# Patient Record
Sex: Male | Born: 1980
Health system: Southern US, Community
[De-identification: ages and names within clinical notes are randomized; demographics above are authoritative.]

## PROBLEM LIST (undated history)

## (undated) DIAGNOSIS — I10 Essential (primary) hypertension: Secondary | ICD-10-CM

## (undated) DIAGNOSIS — E669 Obesity, unspecified: Secondary | ICD-10-CM

## (undated) DIAGNOSIS — N390 Urinary tract infection, site not specified: Secondary | ICD-10-CM

## (undated) HISTORY — PX: FOOT SURGERY: SHX648

---

## 2008-10-21 ENCOUNTER — Encounter: Admission: RE | Admit: 2008-10-21 | Discharge: 2008-10-21 | Payer: Self-pay | Admitting: Occupational Medicine

## 2011-12-14 ENCOUNTER — Encounter (HOSPITAL_COMMUNITY): Payer: Self-pay | Admitting: Emergency Medicine

## 2011-12-14 ENCOUNTER — Inpatient Hospital Stay (HOSPITAL_COMMUNITY)
Admission: EM | Admit: 2011-12-14 | Discharge: 2011-12-16 | DRG: 584 | Disposition: A | Discharge status: Home / Self Care | Payer: BC Managed Care – PPO | Attending: Family Medicine | Admitting: Family Medicine

## 2011-12-14 DIAGNOSIS — R7402 Elevation of levels of lactic acid dehydrogenase (LDH): Secondary | ICD-10-CM | POA: Diagnosis present

## 2011-12-14 DIAGNOSIS — N390 Urinary tract infection, site not specified: Secondary | ICD-10-CM

## 2011-12-14 DIAGNOSIS — Z87891 Personal history of nicotine dependence: Secondary | ICD-10-CM

## 2011-12-14 DIAGNOSIS — I959 Hypotension, unspecified: Secondary | ICD-10-CM | POA: Diagnosis present

## 2011-12-14 DIAGNOSIS — R112 Nausea with vomiting, unspecified: Secondary | ICD-10-CM

## 2011-12-14 DIAGNOSIS — R000 Tachycardia, unspecified: Secondary | ICD-10-CM | POA: Diagnosis present

## 2011-12-14 DIAGNOSIS — D696 Thrombocytopenia, unspecified: Secondary | ICD-10-CM

## 2011-12-14 DIAGNOSIS — E860 Dehydration: Secondary | ICD-10-CM

## 2011-12-14 DIAGNOSIS — D72819 Decreased white blood cell count, unspecified: Secondary | ICD-10-CM | POA: Diagnosis present

## 2011-12-14 DIAGNOSIS — I498 Other specified cardiac arrhythmias: Secondary | ICD-10-CM | POA: Diagnosis present

## 2011-12-14 DIAGNOSIS — N179 Acute kidney failure, unspecified: Secondary | ICD-10-CM | POA: Diagnosis present

## 2011-12-14 DIAGNOSIS — R7401 Elevation of levels of liver transaminase levels: Secondary | ICD-10-CM | POA: Diagnosis present

## 2011-12-14 DIAGNOSIS — R251 Tremor, unspecified: Secondary | ICD-10-CM | POA: Diagnosis present

## 2011-12-14 DIAGNOSIS — A419 Sepsis, unspecified organism: Secondary | ICD-10-CM | POA: Diagnosis present

## 2011-12-14 DIAGNOSIS — R509 Fever, unspecified: Secondary | ICD-10-CM

## 2011-12-14 DIAGNOSIS — A4151 Sepsis due to Escherichia coli [E. coli]: Principal | ICD-10-CM | POA: Diagnosis present

## 2011-12-14 LAB — CBC WITH DIFFERENTIAL/PLATELET
Eosinophils Absolute: 0 10*3/uL (ref 0.0–0.7)
Eosinophils Relative: 0 % (ref 0–5)
Hemoglobin: 14.3 g/dL (ref 13.0–17.0)
Lymphs Abs: 0.9 10*3/uL (ref 0.7–4.0)
MCH: 22.8 pg — ABNORMAL LOW (ref 26.0–34.0)
MCV: 66 fL — ABNORMAL LOW (ref 78.0–100.0)
Monocytes Relative: 6 % (ref 3–12)
Neutrophils Relative %: 61 % (ref 43–77)
RBC: 6.27 MIL/uL — ABNORMAL HIGH (ref 4.22–5.81)

## 2011-12-14 LAB — URINALYSIS, ROUTINE W REFLEX MICROSCOPIC
Glucose, UA: NEGATIVE mg/dL
Ketones, ur: NEGATIVE mg/dL
Specific Gravity, Urine: 1.019 (ref 1.005–1.030)
pH: 5.5 (ref 5.0–8.0)

## 2011-12-14 LAB — RAPID URINE DRUG SCREEN, HOSP PERFORMED
Amphetamines: NOT DETECTED
Benzodiazepines: NOT DETECTED
Cocaine: NOT DETECTED
Opiates: NOT DETECTED

## 2011-12-14 LAB — HEPATIC FUNCTION PANEL
Bilirubin, Direct: 0.7 mg/dL — ABNORMAL HIGH (ref 0.0–0.3)
Indirect Bilirubin: 0.5 mg/dL (ref 0.3–0.9)
Total Bilirubin: 1.2 mg/dL (ref 0.3–1.2)

## 2011-12-14 LAB — URINE MICROSCOPIC-ADD ON

## 2011-12-14 LAB — ETHANOL: Alcohol, Ethyl (B): <11 mg/dL (ref 0–11)

## 2011-12-14 LAB — BASIC METABOLIC PANEL
CO2: 16 mEq/L — ABNORMAL LOW (ref 19–32)
Glucose, Bld: 177 mg/dL — ABNORMAL HIGH (ref 70–99)
Potassium: 3.7 mEq/L (ref 3.5–5.1)
Sodium: 137 mEq/L (ref 135–145)

## 2011-12-14 MED ORDER — METOPROLOL TARTRATE 1 MG/ML IV SOLN
5.0000 mg | Freq: Once | INTRAVENOUS | Status: AC
  Administered 2011-12-14: 5 mg via INTRAVENOUS

## 2011-12-14 MED ORDER — LORAZEPAM 2 MG/ML IJ SOLN
1.0000 mg | Freq: Once | INTRAMUSCULAR | Status: AC
  Administered 2011-12-14: 2 mg via INTRAVENOUS

## 2011-12-14 MED ORDER — ADENOSINE 6 MG/2ML IV SOLN
6.0000 mg | Freq: Once | INTRAVENOUS | Status: AC
Start: 2011-12-14 — End: 2011-12-14
  Administered 2011-12-14: 6 mg via INTRAVENOUS

## 2011-12-14 MED ORDER — SODIUM CHLORIDE 0.9 % IV SOLN
INTRAVENOUS | Status: DC
  Administered 2011-12-14 (×2): via INTRAVENOUS

## 2011-12-14 MED ORDER — METOPROLOL TARTRATE 1 MG/ML IV SOLN
INTRAVENOUS | Status: AC
  Administered 2011-12-14: 5 mg via INTRAVENOUS
  Filled 2011-12-14: qty 5

## 2011-12-14 MED ORDER — ADENOSINE 6 MG/2ML IV SOLN
12.0000 mg | Freq: Once | INTRAVENOUS | Status: AC
  Administered 2011-12-14: 12 mg via INTRAVENOUS

## 2011-12-14 MED ORDER — SODIUM CHLORIDE 0.9 % IV BOLUS (SEPSIS)
1000.0000 mL | Freq: Once | INTRAVENOUS | Status: AC
  Administered 2011-12-14: 1000 mL via INTRAVENOUS

## 2011-12-14 MED ORDER — LORAZEPAM 2 MG/ML IJ SOLN
1.0000 mg | Freq: Once | INTRAMUSCULAR | Status: AC
  Administered 2011-12-14: 1 mg via INTRAVENOUS
  Filled 2011-12-14: qty 1

## 2011-12-14 MED ORDER — DEXTROSE 5 % IV SOLN
1.0000 g | Freq: Once | INTRAVENOUS | Status: AC
  Administered 2011-12-14: 1 g via INTRAVENOUS
  Filled 2011-12-14: qty 10

## 2011-12-14 MED ORDER — ADENOSINE 6 MG/2ML IV SOLN
6.0000 mg | Freq: Once | INTRAVENOUS | Status: AC
  Administered 2011-12-14: 6 mg via INTRAVENOUS
  Filled 2011-12-14 (×2): qty 6

## 2011-12-14 MED ORDER — LORAZEPAM 2 MG/ML IJ SOLN
INTRAMUSCULAR | Status: AC
  Administered 2011-12-14: 2 mg via INTRAVENOUS
  Filled 2011-12-14: qty 1

## 2011-12-14 MED ORDER — ACETAMINOPHEN 325 MG PO TABS
975.0000 mg | ORAL_TABLET | Freq: Once | ORAL | Status: AC
  Administered 2011-12-14: 975 mg via ORAL
  Filled 2011-12-14: qty 3

## 2011-12-14 NOTE — ED Notes (Signed)
Patient currently sitting up in bed; no respiratory or acute distress noted.  Patient updated on plan of care; informed patient that we are currently waiting on urine results to come back; patient has no other questions or concerns at this time.  Patient has stopped shaking; patient seems relaxed.  Heart rate 110's-120's.  Family present at bedside.  Will continue to monitor.

## 2011-12-14 NOTE — ED Provider Notes (Addendum)
History     CSN: [id number]  Arrival date & time 12/14/11  8295   First MD Initiated Contact with Patient 12/14/11 1939      Chief Complaint  Patient presents with  . Tachycardia    (Consider location/radiation/quality/duration/timing/severity/associated sxs/prior treatment) HPI Comments: Richard Villa is a 31 y.o. Male patient presents for "shaking." He arrived by private vehicle. At triage, his pulse was 119. He was immediately brought to the back. Interventional care was initiated.   Level V Caveat- severe illness  The history is provided by the patient.    No past medical history on file.  No past surgical history on file.  No family history on file.  History  Substance Use Topics  . Smoking status: Not on file  . Smokeless tobacco: Not on file  . Alcohol Use: Not on file      Review of Systems  Unable to perform ROS   Allergies  Toradol  Home Medications   Current Outpatient Rx  Name Route Sig Dispense Refill  . MELATONIN PO Oral Take 1 tablet by mouth at bedtime as needed. For sleep    . PROMETHAZINE HCL 25 MG PO TABS Oral Take 25 mg by mouth every 6 (six) hours as needed. For nausea/vomiting    . PSEUDOEPH-DOXYLAMINE-DM-APAP 60-7.10-02-998 MG/30ML PO LIQD Oral Take 30 mLs by mouth at bedtime as needed. For pain/fever.    Marland Kitchen PSEUDOEPHEDRINE-APAP-DM 62-130-86 MG/30ML PO LIQD Oral Take 30 mLs by mouth 3 (three) times daily as needed. For fever/pain    . SULFAMETHOXAZOLE-TMP DS 800-160 MG PO TABS Oral Take 1 tablet by mouth 2 (two) times daily. X 10 days. Started on 12/12/11      BP 100/58  Pulse 120  Temp 98.9 F (37.2 C) (Oral)  Resp 20  SpO2 97%  Physical Exam  Nursing note and vitals reviewed. Constitutional: He is oriented to person, place, and time. He appears well-developed and well-nourished. He appears distressed (moderate).  HENT:  Head: Normocephalic and atraumatic.  Right Ear: External ear normal.  Left Ear: External ear normal.    Eyes: Conjunctivae and EOM are normal. Pupils are equal, round, and reactive to light.  Neck: Normal range of motion and phonation normal. Neck supple.  Cardiovascular: Normal rate, regular rhythm, normal heart sounds and intact distal pulses.   Pulmonary/Chest: Effort normal and breath sounds normal. He exhibits no bony tenderness.  Abdominal: Soft. Normal appearance. There is no tenderness.  Musculoskeletal: Normal range of motion.  Neurological: He is alert and oriented to person, place, and time. He has normal strength. No cranial nerve deficit or sensory deficit. He exhibits normal muscle tone. Coordination normal.  Skin: Skin is warm, dry and intact.  Psychiatric: He has a normal mood and affect. His behavior is normal. Judgment and thought content normal.    ED Course  Procedures (including critical care time)  Initial Emergent care: He was prepared for adenosine cardioversion. He was given 2 rounds of 6 mg of adenosine, followed by 12 mg IV, without conversion. The bolus, was done, and his blood sugar improved to greater than 120 systolic. He was then given IV pressor 5 mg. The heart rate, then gradually improved to about 130. He continued to be tachycardic with normal low, blood pressure. Temperature was elevated at 102.9, he was treated with Tylenol. He also received Ativan, for continued shakiness.   Patient's fianc arrived to get additional history. Patient was also able to contribute better at around 2200 hrs. Marland Kitchen He  relates having a urinary tract infection, treated, 2 days ago, with Septra and Toradol orally. He drinks socially, but not heavily by report, the patient and fiancee. 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   Additional treatment, Rocephin, IV for UTI.   Date: 12/14/2011-19:35  Rate: 171  Rhythm: supraventricular tachycardia (SVT)  QRS Axis: normal  Intervals: normal  ST/T Wave abnormalities: nonspecific ST changes  Conduction Disutrbances:none  Narrative  Interpretation:   Old EKG Reviewed: none available   Date: 12/14/2011-20:00  Rate: 135  Rhythm: sinus tachycardia  QRS Axis: normal  Intervals: normal  ST/T Wave abnormalities: nonspecific ST changes  Conduction Disutrbances:none  Narrative Interpretation: Artifact  Old EKG Reviewed: changes noted and rate slower     CRITICAL CARE Performed by: Mancel Bale L   Total critical care time: 50 min  Critical care time was exclusive of separately billable procedures and treating other patients.  Critical care was necessary to treat or prevent imminent or life-threatening deterioration.  Critical care was time spent personally by me on the following activities: development of treatment plan with patient and/or surrogate as well as nursing, discussions with consultants, evaluation of patient's response to treatment, examination of patient, obtaining history from patient or surrogate, ordering and performing treatments and interventions, ordering and review of laboratory studies, ordering and review of radiographic studies, pulse oximetry and re-evaluation of patient's condition.   Labs Reviewed  BASIC METABOLIC PANEL - Abnormal; Notable for the following:    CO2 16 (*)     Glucose, Bld 177 (*)     Creatinine, Ser 1.76 (*)     GFR calc non Af Amer 50 (*)     GFR calc Af Amer 57 (*)     All other components within normal limits  CBC WITH DIFFERENTIAL - Abnormal; Notable for the following:    WBC 2.7 (*)     RBC 6.27 (*)     MCV 66.0 (*)     MCH 22.8 (*)     Platelets 111 (*)  PLATELET COUNT CONFIRMED BY SMEAR   All other components within normal limits  URINALYSIS, ROUTINE W REFLEX MICROSCOPIC - Abnormal; Notable for the following:    Color, Urine AMBER (*)  BIOCHEMICALS MAY BE AFFECTED BY COLOR   APPearance CLOUDY (*)     Hgb urine dipstick LARGE (*)     Bilirubin Urine SMALL (*)     Protein, ur 100 (*)     Urobilinogen, UA 2.0 (*)     Leukocytes, UA SMALL (*)     All  other components within normal limits  AMMONIA - Abnormal; Notable for the following:    Ammonia 74 (*)     All other components within normal limits  PROTIME-INR - Abnormal; Notable for the following:    Prothrombin Time 15.3 (*)     All other components within normal limits  URINE MICROSCOPIC-ADD ON - Abnormal; Notable for the following:    Squamous Epithelial / LPF FEW (*)     Bacteria, UA MANY (*)     Casts GRANULAR CAST (*)     All other components within normal limits  HEPATIC FUNCTION PANEL - Abnormal; Notable for the following:    AST 88 (*)     ALT 122 (*)     Alkaline Phosphatase 149 (*)     Bilirubin, Direct 0.7 (*)     All other components within normal limits  TROPONIN I  URINE RAPID DRUG SCREEN (HOSP PERFORMED)  ETHANOL  No results found.   1. UTI (lower urinary tract infection)   2. Dehydration   3. Leukopenia   4. Thrombocytopenia   5. Tachycardia       MDM  Febrile illness with extreme sinus tachycardia.  Doubt SVT .Apparent urinary tract infection.  Possibly incidental, ammonia elevation, however, component of hepatic encephalopathy is suspected. This would tend to indicate that the patient is a heavy alcohol user. Low white cells and platelets with normal Hb, is worrisome for possible pancytopenia, with dehydration. Pt needs admission.       Flint Melter, MD 12/14/11 2316  Flint Melter, MD 12/15/11 0000

## 2011-12-14 NOTE — ED Notes (Addendum)
Patient came in from triage with SVT rhythm (heart rate from 170-180s); EDP (Dr. Effie Shy) notified upon patient's arrival.  Patient changed into gown and connected to continuous cardiac, pulse ox, and blood pressure monitor.  Patient very diaphoretic and shaking.  Patient first given 6 mg Adenosine IVP per verbal order by Dr. Effie Shy.  Heart rate decreased to 150s, then back up to 170s.  Another 6 mg Adenosine pushed, per verbal order by Dr. Effie Shy.  Heart rate decreased to 160s again.  Another 12 mg Adenosine pushed; heart reate decreased to 140s-150s.  5 mg Lopressor given, per verbal order by Dr. Effie Shy.  Heart rate decreased to 120s-130s; sinus tach rhythm.  1 mg Ativan given IVP per verbal order by Dr. Effie Shy due to patient's shakiness.  Patient now 130-140s heart rate; patient denies pain at this time.  Still shaking.  Patient states that he started "feeling weird" while sitting on the couch at home; called neighbor.  Neighbor drove patient to hospital. Patient denies doing any drugs or drinking alcohol tonight.  Denies any past medical history.  Patient's pupils equal, but dilated.  Patient alert and oriented x4; will continue to monitor.  Crash cart placed outside patient's room.

## 2011-12-14 NOTE — ED Notes (Signed)
Dr. Effie Shy notified about fever; verbal order verified by read back to give patient 975 mg Tylenol PO.

## 2011-12-14 NOTE — ED Notes (Signed)
Admitting MD at bedside.

## 2011-12-14 NOTE — ED Notes (Signed)
Pt was just standing and felt numbness in hands; pouring sweat; brought in by neighbor; HR 180's and very diaphoretic. Pt is pale and shaking. Pt reports he has been sick for 3 days.

## 2011-12-14 NOTE — H&P (Signed)
Richard Villa is an 31 y.o. male.   Chief Complaint: Tachycardia, shakiness HPI: 31 yo otherwise healthy M p/w "shaking."  Upon arrival to the ED, he was found to be in SVT (HR 180s), diaphoretic, shaking.  He was given adenosine x3 (6mg , 6mg , 12mg ) with minimal response in his heart rate.  He was also given metoprolol 5mg  IV with minimal response in HR (briefly decreased to 150's per EDP report).   His HR began to resolve with IVF, and he is now s/p 1L NS bolus with IVF going at 125cc/hr.  While in the ED, pt became febrile and was given Tylenol x1 for this.  Patient is here with fiance, who gave most of the history due to pt being very tired (s/p ativan 1mg  x2).  Fiance reports that pt was in his usual state of health until Wednesday (3 days ago) when he had a short period of time with significant low back and pelvic pain as well as dysuria and a small amount of hematuria.  They thought that he passed a kidney stone.  He started having fevers Wed evening (up to 102).  They were seen at [facility] on Thursday and he was diagnosed with UTI and started on Septra.  He was also given Rx's for ketorolac and phenergan, but he only took a few doses of each because he started feeling better from a pain and nausea stand point.  He continued to have fevers off and on, which they treated with tylenol, Dayquil, and Nyquill.  Still having low back pain and feels general malaise/weakness all over.  Did not have any chest pain or palpatiations with this episode.  Did not have any shortness of breath.  Did have some confusion and generalized weakness.    Of note, pt does drink EtOH.  Per fiance it averages 4 days/week and will be 1-2 drinks at a time (wine, liquor or beer).   She reports that her friends say this is significantly less than he used to drink 5 years ago.  He father is an alcoholic and has required a valve replacement (x2, same valve).  They report his last drink as last week.  He does have a h/o tobacco use, but has  not smoked cigarettes in >5 years.  He continues to smoke ~ 1 cigar/week.  He has a remote h/o THC and cocaine use (both >10 years ago).  Fiance does think that pt has looked "a little yellow" for the past few months.  No new medications other than Septra, no new supplements or OTC medications.  Also of note, patient is a [employer] fund[employer] and works in a lab with chemicals.  He has not been to work since he became sick on Wednesday and there have been no recent spills/contaminations that either he or the fiance know of.    No past medical history on file.  No past surgical history on file.  No family history on file. Social History:  does not have a smoking history on file. He does not have any smokeless tobacco history on file. His alcohol and drug histories not on file.  Allergies:  Allergies  Allergen Reactions  . Toradol (Ketorolac Tromethamine) Other (See Comments)    headache     (Not in a hospital admission)  Results for orders placed during the hospital encounter of 12/14/11 (from the past 48 hour(s))  BASIC METABOLIC PANEL     Status: Abnormal   Collection Time   12/14/11  7:47 PM  Component Value Range Comment   Sodium 137  135 - 145 mEq/L    Potassium 3.7  3.5 - 5.1 mEq/L    Chloride 101  96 - 112 mEq/L    CO2 16 (*) 19 - 32 mEq/L    Glucose, Bld 177 (*) 70 - 99 mg/dL    BUN 16  6 - 23 mg/dL    Creatinine, Ser 1.61 (*) 0.50 - 1.35 mg/dL    Calcium 9.6  8.4 - 09.6 mg/dL    GFR calc non Af Amer 50 (*) >90 mL/min    GFR calc Af Amer 57 (*) >90 mL/min   CBC WITH DIFFERENTIAL     Status: Abnormal   Collection Time   12/14/11  7:47 PM      Component Value Range Comment   WBC 2.7 (*) 4.0 - 10.5 K/uL    RBC 6.27 (*) 4.22 - 5.81 MIL/uL    Hemoglobin 14.3  13.0 - 17.0 g/dL    HCT 04.5  40.9 - 81.1 %    MCV 66.0 (*) 78.0 - 100.0 fL    MCH 22.8 (*) 26.0 - 34.0 pg    MCHC 34.5  30.0 - 36.0 g/dL    RDW 91.4  78.2 - 95.6 %    Platelets 111 (*) 150 - 400 K/uL PLATELET COUNT  CONFIRMED BY SMEAR   Neutrophils Relative 61  43 - 77 %    Neutro Abs 1.7  1.7 - 7.7 K/uL    Lymphocytes Relative 32  12 - 46 %    Lymphs Abs 0.9  0.7 - 4.0 K/uL    Monocytes Relative 6  3 - 12 %    Monocytes Absolute 0.2  0.1 - 1.0 K/uL    Eosinophils Relative 0  0 - 5 %    Eosinophils Absolute 0.0  0.0 - 0.7 K/uL    Basophils Relative 1  0 - 1 %    Basophils Absolute 0.0  0.0 - 0.1 K/uL   TROPONIN I     Status: Normal   Collection Time   12/14/11  7:47 PM      Component Value Range Comment   Troponin I <0.30  <0.30 ng/mL   ETHANOL     Status: Normal   Collection Time   12/14/11  7:47 PM      Component Value Range Comment   Alcohol, Ethyl (B) <11  0 - 11 mg/dL   AMMONIA     Status: Abnormal   Collection Time   12/14/11  9:06 PM      Component Value Range Comment   Ammonia 74 (*) 11 - 60 umol/L   PROTIME-INR     Status: Abnormal   Collection Time   12/14/11  9:06 PM      Component Value Range Comment   Prothrombin Time 15.3 (*) 11.6 - 15.2 seconds    INR 1.18  0.00 - 1.49   URINALYSIS, ROUTINE W REFLEX MICROSCOPIC     Status: Abnormal   Collection Time   12/14/11  9:41 PM      Component Value Range Comment   Color, Urine AMBER (*) YELLOW BIOCHEMICALS MAY BE AFFECTED BY COLOR   APPearance CLOUDY (*) CLEAR    Specific Gravity, Urine 1.019  1.005 - 1.030    pH 5.5  5.0 - 8.0    Glucose, UA NEGATIVE  NEGATIVE mg/dL    Hgb urine dipstick LARGE (*) NEGATIVE    Bilirubin Urine SMALL (*) NEGATIVE  Ketones, ur NEGATIVE  NEGATIVE mg/dL    Protein, ur 161 (*) NEGATIVE mg/dL    Urobilinogen, UA 2.0 (*) 0.0 - 1.0 mg/dL    Nitrite NEGATIVE  NEGATIVE    Leukocytes, UA SMALL (*) NEGATIVE   URINE RAPID DRUG SCREEN (HOSP PERFORMED)     Status: Normal   Collection Time   12/14/11  9:41 PM      Component Value Range Comment   Opiates NONE DETECTED  NONE DETECTED    Cocaine NONE DETECTED  NONE DETECTED    Benzodiazepines NONE DETECTED  NONE DETECTED    Amphetamines NONE DETECTED  NONE  DETECTED    Tetrahydrocannabinol NONE DETECTED  NONE DETECTED    Barbiturates NONE DETECTED  NONE DETECTED   URINE MICROSCOPIC-ADD ON     Status: Abnormal   Collection Time   12/14/11  9:41 PM      Component Value Range Comment   Squamous Epithelial / LPF FEW (*) RARE    WBC, UA 7-10  <3 WBC/hpf    RBC / HPF 7-10  <3 RBC/hpf    Bacteria, UA MANY (*) RARE    Casts GRANULAR CAST (*) NEGATIVE    No results found.  Review of Systems  Constitutional: Positive for fever, chills and malaise/fatigue.  HENT: Negative for congestion and sore throat.   Eyes: Negative for blurred vision and double vision.  Respiratory: Positive for cough. Negative for shortness of breath and wheezing.   Cardiovascular: Negative for chest pain, palpitations and leg swelling.  Gastrointestinal: Negative for heartburn, nausea, vomiting, abdominal pain, diarrhea and constipation.  Genitourinary: Positive for dysuria and hematuria.  Musculoskeletal: Positive for back pain.  Skin: Negative for rash.  Neurological: Positive for dizziness, tingling, weakness and headaches.  Psychiatric/Behavioral: Negative for substance abuse.    Blood pressure 100/58, pulse 120, temperature 98.9 F (37.2 C), temperature source Oral, resp. rate 20, SpO2 97.00%. Physical Exam  Constitutional: He is oriented to person, place, and time. He appears well-developed and well-nourished.  HENT:  Head: Normocephalic and atraumatic.  Right Ear: External ear normal.  Left Ear: External ear normal.  Mouth/Throat: Oropharynx is clear and moist. No oropharyngeal exudate.  Eyes: Conjunctivae and EOM are normal. Pupils are equal, round, and reactive to light. No scleral icterus.  Neck: Normal range of motion. Neck supple.  Cardiovascular: Regular rhythm and intact distal pulses.  Tachycardia present.   No murmur heard. Respiratory: Effort normal and breath sounds normal. No respiratory distress. He has no wheezes.  GI: Soft. Bowel sounds are  normal. He exhibits no distension. There is no tenderness. There is no rebound.  Genitourinary: Testes normal and penis normal. Rectal exam shows no tenderness and anal tone normal. Guaiac negative stool. Prostate is not enlarged and not tender.  Musculoskeletal: Normal range of motion. He exhibits no edema.  Lymphadenopathy:    He has no cervical adenopathy.  Neurological: He is alert and oriented to person, place, and time. No cranial nerve deficit. Coordination normal.  Skin: Skin is warm. No rash noted. He is diaphoretic. No erythema.     Assessment/Plan 31 yo M with no PMHx p/w tachycardia and "shaking"  1. 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 Fever: unclear source, although may be from UTI -WBC low at 2.7 -s/p CTX 1gm in ED, was on septra as outpatient -add gram stain to urine and await culture results for treatment -should try to avoid tylenol due to mildly elevated LFTs but also avoid NSAIDs due to mildly elevated Cr. -will add ampicillin to cover enterococcus while awaiting gram stain; consider continuing CTX if gram stain shows GNRs   3. AKI: Cr 1.76, may be 2/2 dehydration + ketorolac for pain -IVF -recheck Cr in AM -avoid nephrotoxic medications such as NSAIDs for now   4.  H/o EtOH use: fiance reports that use is NOT daily and is significantly less heavy than previously; last drink was ~ 6-7 days ago -CIWA protocol   5. Transaminitis: mildly elevated LFTs -pt was  on Septra, which could cause some mild hepatitis and hemolysis  -pt has also been taking tylenol and tylenol-containing products; will check acetaminophen level to r/o accidental OD   FEN/GI: NPO in case of need for procedure, NS @ 125cc/hr Ppx: SQ heparin Dispo: admit to SDU for close monitoring overnight. D/c pending clinical improvement   Aser Nylund 12/14/2011, 11:05 PM

## 2011-12-14 NOTE — ED Notes (Signed)
Patient starting to bleed around both IV insertion sites; Dr. Effie Shy notified.  Blood cleaned around IV sites; IVs still intact; flushable.  No active bleeding at this time.

## 2011-12-14 NOTE — ED Notes (Signed)
Patient currently resting quietly in bed; no respiratory or acute distress noted.  Patient updated on plan of care; informed patient that we are currently waiting for Rocephin to finish infusing.  Patient has no other questions or concerns at this time; will continue to monitor.

## 2011-12-15 ENCOUNTER — Encounter (HOSPITAL_COMMUNITY): Payer: Self-pay

## 2011-12-15 LAB — MRSA PCR SCREENING: MRSA by PCR: NEGATIVE

## 2011-12-15 LAB — CREATININE, SERUM
Creatinine, Ser: 1.12 mg/dL (ref 0.50–1.35)
GFR calc non Af Amer: 86 mL/min — ABNORMAL LOW (ref >90)

## 2011-12-15 LAB — CARDIAC PANEL(CRET KIN+CKTOT+MB+TROPI)
CK, MB: 9.2 ng/mL (ref 0.3–4.0)
CK, MB: 10.4 ng/mL (ref 0.3–4.0)
Relative Index: 0.7 (ref 0.0–2.5)
Total CK: 1207 U/L — ABNORMAL HIGH (ref 7–232)
Total CK: 1575 U/L — ABNORMAL HIGH (ref 7–232)
Total CK: 1933 U/L — ABNORMAL HIGH (ref 7–232)
Troponin I: 0.41 ng/mL (ref <0.30)
Troponin I: 0.33 ng/mL (ref <0.30)

## 2011-12-15 LAB — COMPREHENSIVE METABOLIC PANEL
Albumin: 2.9 g/dL — ABNORMAL LOW (ref 3.5–5.2)
BUN: 15 mg/dL (ref 6–23)
Creatinine, Ser: 1.04 mg/dL (ref 0.50–1.35)
GFR calc Af Amer: >90 mL/min (ref >90)
Glucose, Bld: 102 mg/dL — ABNORMAL HIGH (ref 70–99)
Total Bilirubin: 0.9 mg/dL (ref 0.3–1.2)
Total Protein: 6.1 g/dL (ref 6.0–8.3)

## 2011-12-15 LAB — OCCULT BLOOD, POC DEVICE: Fecal Occult Bld: POSITIVE

## 2011-12-15 LAB — CBC
HCT: 34.8 % — ABNORMAL LOW (ref 39.0–52.0)
MCHC: 33.7 g/dL (ref 30.0–36.0)
MCV: 66 fL — ABNORMAL LOW (ref 78.0–100.0)
Platelets: 98 10*3/uL — ABNORMAL LOW (ref 150–400)
RBC: 5.27 MIL/uL (ref 4.22–5.81)
RDW: 14.4 % (ref 11.5–15.5)
WBC: 6.3 10*3/uL (ref 4.0–10.5)

## 2011-12-15 LAB — TSH: TSH: 2.193 u[IU]/mL (ref 0.350–4.500)

## 2011-12-15 MED ORDER — ADULT MULTIVITAMIN W/MINERALS CH
1.0000 | ORAL_TABLET | Freq: Every day | ORAL | Status: DC
  Administered 2011-12-15 – 2011-12-16 (×2): 1 via ORAL
  Filled 2011-12-15 (×2): qty 1

## 2011-12-15 MED ORDER — ONDANSETRON HCL 4 MG PO TABS: 4.0000 mg | ORAL_TABLET | Freq: Four times a day (QID) | ORAL | Status: DC | PRN

## 2011-12-15 MED ORDER — THIAMINE HCL 100 MG/ML IJ SOLN
100.0000 mg | Freq: Every day | INTRAMUSCULAR | Status: DC
  Filled 2011-12-15 (×2): qty 1

## 2011-12-15 MED ORDER — TRAMADOL HCL 50 MG PO TABS
50.0000 mg | ORAL_TABLET | Freq: Four times a day (QID) | ORAL | Status: DC | PRN
  Administered 2011-12-15: 50 mg via ORAL
  Filled 2011-12-15 (×2): qty 1

## 2011-12-15 MED ORDER — OXYCODONE HCL 5 MG PO TABS
5.0000 mg | ORAL_TABLET | ORAL | Status: DC | PRN
  Administered 2011-12-15 – 2011-12-16 (×3): 5 mg via ORAL
  Filled 2011-12-15 (×3): qty 1

## 2011-12-15 MED ORDER — LORAZEPAM 2 MG/ML IJ SOLN: 1.0000 mg | Freq: Four times a day (QID) | INTRAMUSCULAR | Status: DC | PRN

## 2011-12-15 MED ORDER — SODIUM CHLORIDE 0.9 % IV SOLN
2.0000 g | Freq: Four times a day (QID) | INTRAVENOUS | Status: DC
  Administered 2011-12-15 – 2011-12-16 (×6): 2 g via INTRAVENOUS
  Filled 2011-12-15 (×8): qty 2000

## 2011-12-15 MED ORDER — LORAZEPAM 1 MG PO TABS: 1.0000 mg | ORAL_TABLET | Freq: Four times a day (QID) | ORAL | Status: DC | PRN

## 2011-12-15 MED ORDER — VITAMIN B-1 100 MG PO TABS
100.0000 mg | ORAL_TABLET | Freq: Every day | ORAL | Status: DC
  Administered 2011-12-15 – 2011-12-16 (×2): 100 mg via ORAL
  Filled 2011-12-15 (×2): qty 1

## 2011-12-15 MED ORDER — FOLIC ACID 1 MG PO TABS
1.0000 mg | ORAL_TABLET | Freq: Every day | ORAL | Status: DC
  Administered 2011-12-15 – 2011-12-16 (×2): 1 mg via ORAL
  Filled 2011-12-15 (×2): qty 1

## 2011-12-15 MED ORDER — HEPARIN SODIUM (PORCINE) 5000 UNIT/ML IJ SOLN
5000.0000 [IU] | Freq: Three times a day (TID) | INTRAMUSCULAR | Status: DC
  Administered 2011-12-15 – 2011-12-16 (×3): 5000 [IU] via SUBCUTANEOUS
  Filled 2011-12-15 (×7): qty 1

## 2011-12-15 MED ORDER — ONDANSETRON HCL 4 MG/2ML IJ SOLN: 4.0000 mg | Freq: Four times a day (QID) | INTRAMUSCULAR | Status: DC | PRN

## 2011-12-15 NOTE — ED Notes (Signed)
Patient being transported upstairs on portable cardiac monitor with RN. 

## 2011-12-15 NOTE — H&P (Signed)
I interviewed and examined this patient and discussed the care plan with Dr. Fara Boros and the [facility] team and agree with assessment and plan as documented in the admission note for today. It doesn't appear that alcohol intake has been significant enough to contribute to the LFT elevation. The acute rise suggests injury, perhaps from hypoperfusion. The "tetanic" symptoms that occurred in the ER may have been from hyperventilation or toxicity from the OTC cold medications that weren't being normally metabolized by the liver. Further liver tests may clarify the sequence of events. He seems to be responding well to his UTI treatment.     Richard Villa A. Sheffield Slider, MD Family Medicine Teaching Service Attending  12/15/2011 12:27 PM

## 2011-12-15 NOTE — Progress Notes (Signed)
12/15/2011 11:36 AM Gertie Gowda  [id number]  Report called to RN on [id number]. VSS. Transferred with belongings to new room 6741. Family at bedside  Celesta Gentile 8/11/201311:36 AM

## 2011-12-15 NOTE — Progress Notes (Signed)
Daily Progress Note Family Medicine Teaching Service Lacresha Fusilier M. Joevon Holliman, MD Service Pager: [phone]  Subjective: Patient states he is feeling better today. No specific complaints other than headache. Denies abd pain, dysuria or chest pain. Girlfriend is at bedside.  Objective: Vital signs in last 24 hours: Temp:  [98.2 F (36.8 C)-102.9 F (39.4 C)] 98.2 F (36.8 C) (08/11 0726) Pulse Rate:  [93-190] 93  (08/11 0300) Resp:  [14-47] 30  (08/11 0300) BP: (96-129)/(54-91) 102/66 mmHg (08/11 0130) SpO2:  [93 %-100 %] 98 % (08/11 0300) FiO2 (%):  [2 %] 2 % (08/10 2243) Weight:  [265 lb 3.4 oz (120.3 kg)] 265 lb 3.4 oz (120.3 kg) (08/11 0130) Weight change:  Last BM Date: 12/15/11  Intake/Output from previous day: 08/10 0701 - 08/11 0700 In: 2050 [I.V.:2000; IV Piggyback:50] Out: 1500 [Urine:1500] Intake/Output this shift: Total I/O In: -  Out: 350 [Urine:350]  Constitutional: He is oriented to person, place, and time. He appears well-developed and well-nourished.  HEENT:  Normocephalic and atraumatic. MMM Neck: Normal range of motion. Neck supple.  Cardiovascular: Regular rate and rhythm and intact distal pulses. No murmur heard.  Respiratory: Effort normal and breath sounds normal. He has no wheezes.  GI: Soft. He exhibits no distension. There is no tenderness. There is no rebound.  Musculoskeletal: Normal range of motion. He exhibits no edema.  Lymphadenopathy: He has no cervical adenopathy.  Neurological: He is alert and oriented to person, place, and time. No cranial nerve deficit. Coordination normal.  Skin: Skin is warm. No rash noted.   Lab Results:  Basename 12/15/11 0505 12/15/11 0201  WBC 6.3 6.8  HGB 11.7* 11.9*  HCT 34.8* 35.3*  PLT 98* 88*   BMET  Basename 12/15/11 0505 12/15/11 0201 12/14/11 1947  NA 139 -- 137  K 3.4* -- 3.7  CL 106 -- 101  CO2 20 -- 16*  GLUCOSE 102* -- 177*  BUN 15 -- 16  CREATININE 1.04 1.12 --  CALCIUM 8.3* -- 9.6    Medications:  I have reviewed the patient's current medications. Scheduled:   . acetaminophen  975 mg Oral Once  . adenosine (ADENOCARD) IV  12 mg Intravenous Once  . adenosine (ADENOCARD) IV  6 mg Intravenous Once  . adenosine (ADENOCARD) IV  6 mg Intravenous Once  . ampicillin (OMNIPEN) IV  2 g Intravenous Q6H  . cefTRIAXone (ROCEPHIN) IVPB 1 gram/50 mL D5W  1 g Intravenous Once  . folic acid  1 mg Oral Daily  . heparin  5,000 Units Subcutaneous Q8H  . LORazepam  1 mg Intravenous Once  . LORazepam  1 mg Intravenous Once  . metoprolol  5 mg Intravenous Once  . multivitamin with minerals  1 tablet Oral Daily  . sodium chloride  1,000 mL Intravenous Once  . thiamine  100 mg Oral Daily   Or  . thiamine  100 mg Intravenous Daily   Continuous:   . sodium chloride 125 mL/hr at 12/14/11 2126   AVW:[id number], LORazepam, ondansetron (ZOFRAN) IV, ondansetron, oxyCODONE  Assessment/Plan: 31 yo M with no PMHx p/w tachycardia  1. Tachycardia: HR currently <100 with HR of 71 on AM EKG, improved from initial 180s-190s; unclear etiology at this time, one concern is EtOH induced cardiomyopathy with acute tachycardia 2/2 illness causing dehydration + EtOH withdrawal, but keeping anticholinergic medications in mind as well along with thyroid dysfunction, serotonin syndrome, NMS, withdrawal from unknown substance or systemic infection but these are much lower on the differential. Overall, patient  improved and asymptomatic at this time. -responded to IVF; minimal response to adenosine and metoprolol in the ED. Continue IVF for now, but will decrease later today after he his taking good PO and has been stable on telemetry. -BPs remain on the low side of normal, especially for a 31 yo M  -CK elevated, CKMB elevated and troponin mildly elevated, relative index within normal limits. Elevation likely secondary to the tachycardia but could also be from substance use. Will continue to cycle to  trend. -continue to monitor on telemetry and repeat EKG in AM  -ECHO pending today  -will check TSH today  2. Fever: unclear source, although may be from UTI -WBC low at admission, but stable now in normal range -s/p CTX 1gm in ED, was on septra as outpatient. Continue ampicillin to cover enterococcus. Will restart CTX tonight if sensitivities have not returned by then. -await culture results for treatment. Will call [facility] where he was seen earlier in the week to see if they have a culture result from previous UA -we will avoid tylenol due to mildly elevated LFTs.   3. AKI: Cr 1.76 at admission, may be 2/2 dehydration + ketorolac for pain. Cr now within normal limits -continue IVF and avoid nephrotoxic medications such as NSAIDs for now   4. H/o EtOH use: fiance reports that use is NOT daily and is significantly less heavy than previously; last drink was ~ 6-7 days ago. Has PMH of alcohol poisoning 9 years ago. -CIWA protocol  5. Transaminitis: mildly elevated LFTs at admission, now with greatly elevated LFTs -pt was on Septra, which could cause some mild hepatitis and hemolysis but not likely to cause such drastic rise in LFT -tylenol level WNL -could be secondary to ingestion, but could also be from under profusion if he had a time of hypotension -will check hepatitis panel -repeat CMet in the AM  6. FOBT positive- noted at admission, although negative on stool card collected by Dr. Fara Boros at time of rectal exam -HgB stable at 11.7, platelets low at 98 -no gross blood noted. Will continue to monitor  7. Headache- headache since start of symptoms a few days ago. -avoiding Tylenol given increased LFT. Will treat with Tramadol  FEN/GI: Regular diet, continue NS @ 125cc/hr  Ppx: SQ heparin- will consider d/c if platelets drop Dispo: Transfer out of SDU. D/c pending further clinical improvement and work up   LOS: 1 day   Sylvestre Rathgeber 12/15/2011, 9:04 AM

## 2011-12-15 NOTE — Plan of Care (Signed)
Family Medicine Teaching Service PGY-2 Note  Spoke with clinical staff at [facility]. Per verbal report, patient had a UA with culture/sensititives on December 12, 2011 which showed >100k E.coli. Only resistance was to ESBL (extended release beta lactam.) Susceptible to all other antibiotics tested. Will have report faxed.  Sydny Schnitzler M. Alayjah Boehringer, M.D. 12/15/2011 12:34 PM

## 2011-12-15 NOTE — Progress Notes (Addendum)
CRITICAL VALUE ALERT  Critical value received:  Troponin 0.41, CK MB 9.2  Date of notification:  12/15/2011  Time of notification:  0256  Critical value read back:yes  Nurse who received alert:  B. Katrinka Blazing   MD notified (1st page):  Family Practice  Time of first page:  0300  MD notified (2nd page):  Time of second page:  Responding MD: Dr. Fara Boros   Time MD responded:  [phone]

## 2011-12-15 NOTE — ED Notes (Signed)
Admitting MD at bedside.

## 2011-12-15 NOTE — Progress Notes (Signed)
I interviewed and examined this patient and discussed the care plan with Dr. Mikel Cella and the [facility] team and agree with assessment and plan as documented in the progress note for today. We discussed using Tramadol for headache since NSAIDS and Acetaminophen might contribute to liver injury.     Michelena Culmer A. Sheffield Slider, MD Family Medicine Teaching Service Attending  12/15/2011 12:25 PM

## 2011-12-16 LAB — COMPREHENSIVE METABOLIC PANEL
ALT: 911 U/L — ABNORMAL HIGH (ref 0–53)
AST: 455 U/L — ABNORMAL HIGH (ref 0–37)
CO2: 24 mEq/L (ref 19–32)
Chloride: 101 mEq/L (ref 96–112)
Creatinine, Ser: 0.82 mg/dL (ref 0.50–1.35)
GFR calc non Af Amer: >90 mL/min (ref >90)
Total Bilirubin: 0.5 mg/dL (ref 0.3–1.2)

## 2011-12-16 LAB — HIV ANTIBODY (ROUTINE TESTING W REFLEX): HIV: NONREACTIVE

## 2011-12-16 LAB — CBC
MCH: 22.3 pg — ABNORMAL LOW (ref 26.0–34.0)
MCHC: 34.4 g/dL (ref 30.0–36.0)
Platelets: 122 10*3/uL — ABNORMAL LOW (ref 150–400)

## 2011-12-16 LAB — HEPATITIS PANEL, ACUTE: Hep B C IgM: NEGATIVE

## 2011-12-16 LAB — URINE CULTURE
Colony Count: NO GROWTH
Culture: NO GROWTH

## 2011-12-16 MED ORDER — CEPHALEXIN 500 MG PO CAPS: 500.0000 mg | ORAL_CAPSULE | Freq: Four times a day (QID) | ORAL | Status: AC

## 2011-12-16 MED ORDER — CEPHALEXIN 500 MG PO CAPS
500.0000 mg | ORAL_CAPSULE | Freq: Four times a day (QID) | ORAL | Status: DC
  Administered 2011-12-16 (×2): 500 mg via ORAL
  Filled 2011-12-16 (×4): qty 1

## 2011-12-16 MED ORDER — DEXTROSE 5 % IV SOLN
1.0000 g | INTRAVENOUS | Status: DC
  Administered 2011-12-16: 1 g via INTRAVENOUS
  Filled 2011-12-16: qty 10

## 2011-12-16 MED ORDER — IBUPROFEN 800 MG PO TABS
800.0000 mg | ORAL_TABLET | Freq: Three times a day (TID) | ORAL | Status: DC | PRN
  Administered 2011-12-16: 800 mg via ORAL
  Filled 2011-12-16 (×2): qty 1

## 2011-12-16 NOTE — Discharge Summary (Signed)
I have reviewed this discharge summary and agree.    

## 2011-12-16 NOTE — Progress Notes (Signed)
Family Medicine Teaching Service Attending Note  I interviewed and examined patient Richard Villa and reviewed their tests and x-rays.  I discussed with Dr. Fara Boros and reviewed their note for today.  I agree with their assessment and plan.     Additionally  This AM resting easily.  Alert oriented with normal conversation and recall  Presentation most consistent with sepsis and dehydration that has responded to fluids and antibiotics.  Finish 14 day course of antibiotics and follow for resolution of transaminitis

## 2011-12-16 NOTE — Progress Notes (Signed)
  Echocardiogram 2D Echocardiogram has been performed.  Richard Villa 12/16/2011, 2:02 PM

## 2011-12-16 NOTE — Progress Notes (Addendum)
Family Medicine Teaching Service Daily Progress Note Service Page: [phone]  Patient Assessment: Pt is a healthy 31yo man who presented 12/14/11 with urosepsis notable for sinus tachycardia to 170s  Subjective: Pt is reporting a headache and mild cough this morning. He endorses hydrating well although he has little appetite. He has been able to get up and move around his room. His fiancee is concerned about his lack of attention and is requesting Lyme titer. He denies any history of rash or known tick bite.  Objective: Temp:  [97.9 F (36.6 C)-99.6 F (37.6 C)] 99.1 F (37.3 C) (08/12 0524) Pulse Rate:  [78-103] 103  (08/12 0524) Resp:  [20-24] 20  (08/12 0524) BP: (112-136)/(63-86) 136/86 mmHg (08/12 0524) SpO2:  [93 %-100 %] 93 % (08/12 0524) Weight:  [257 lb 6.4 oz (116.756 kg)] 257 lb 6.4 oz (116.756 kg) (08/11 2015)  Exam: General: young man lying in bed in NAD Cardiovascular: RRR, no murmurs/rubs/gallops Respiratory: CTAB, no wheezes/rales/rhonci Abdomen: soft, nondistended, nontender, no hepatosplenomegaly Extremities: nonedematous, nontender bilateral LEs   I have reviewed the patient's medications, labs, imaging, and diagnostic testing.  Notable results are summarized below.  CBC BMET   Lab 12/16/11 0700 12/15/11 0505 12/15/11 0201  WBC 6.2 6.3 6.8  HGB 12.1* 11.7* 11.9*  HCT 35.2* 34.8* 35.3*  PLT 122* 98* 88*    Lab 12/16/11 0700 12/15/11 0505 12/15/11 0201 12/14/11 1947  NA 137 139 -- 137  K 3.4* 3.4* -- 3.7  CL 101 106 -- 101  CO2 24 20 -- 16*  BUN 7 15 -- 16  CREATININE 0.82 1.04 1.12 --  GLUCOSE 116* 102* -- 177*  CALCIUM 9.1 8.3* -- 9.6     12/16/11: AST: 455, ALT 911, Tot Bili 0.5 12/15/11: AST: 613, ALT 551, Tot Bili 0.9  Imaging/Diagnostic Tests: Echo: pending  Outside Urine Culture [facility] 8/8): E.Coli >100,000 colonies. Resistant only to ESBLs In house Urine Culture 8/10: No Growth Final  Cardiac Enzymes:  8/11 10:00: CK,MB 10.4, CK  total 1575, Troponin I 0.33 8/11 17:35: CK,MB 11.5, CK total 1933, Troponin I <0.30    . ampicillin (OMNIPEN) IV  2 g Intravenous Q6H  . folic acid  1 mg Oral Daily  . heparin  5,000 Units Subcutaneous Q8H  . multivitamin with minerals  1 tablet Oral Daily  . thiamine  100 mg Oral Daily   Or  . thiamine  100 mg Intravenous Daily  ceftriaxone x 2 doses  Plan: 30 yo M with no PMHx p/w tachycardia and UTI  1. Tachycardia: HR 78-103 last 24 hrs. Unclear etiology although likely 2/2 urosepsis with transient hypotension and end organ impact. Other possibilities include EtOH induced cardiomyopathy given his h/o alcohol abuse, although he reports only 1-2 alcoholic drinks a few days a week currently. Question of other substance use although utox negative. TSH WNL. Overall pt improved and asymptomatic at this time. -CK elevated, CKMB elevated and troponin mildly elevated, relative index within normal limits.  -ECHO pending today   2. Fever 2/2 urosepsis  - OSH culture indicated E.Coli, resistant only to ESBL -s/p CTX 1gm in ED 8/10, was on septra as outpatient. Amp added on admission to cover enterococci. 2nd dose of CTX: 8/12 AM -will dc ampicillin at this time as E. Coli identified and transition to keflex 500mg  QID -we will avoid tylenol due to mildly elevated LFTs.  -consider renal ultrasound or CT scan in outpatient setting to rule out underlying structural etiology for UTI -  HIV test today given his unusual presentation w/ urosepsis after initiation of appropriate antibiotic regimen   3. AKI: Cr 1.76 at admission, may be 2/2 urosepsis. Cr now within normal limits  -resolved  4. H/o EtOH use: fiance reports that use is NOT daily and is significantly less heavy than previously; last drink was ~ 6-7 days ago. Has PMH of alcohol poisoning 9 years ago.  -CIWA protocol: scores not meriting intervention thus far  5. Transaminitis: mildly elevated LFTs at admission, now with greatly elevated  LFTs. Unclear etiology: possible end organ damage due to urosepsis and hypotension. Pt was on Septra, which could cause some mild hepatitis and hemolysis but not likely to cause such drastic rise in LFT. tylenol level WNL  -will check hepatitis panel  -advise close f/up on outpt basis  6. FOBT positive- noted at admission, although negative on stool card collected by Dr. Fara Boros at time of rectal exam  -HgB improved to 12.1  -no gross blood noted. Will continue to monitor   7. Headache- headache since start of symptoms a few days ago.  -avoiding Tylenol given increased LFT. Will treat with Tramadol and Motrin.  FEN/GI: Regular diet, SLIV Ppx: SQ heparin- will consider d/c if platelets drop  Dispo: D/c pending today LOS: 1 day    Hedy Camara, Med Student 12/16/2011, 8:36 AM   UPPER LEVEL ADDENDUM  I have seen and examined Gertie Gowda with Hedy Camara, MS4 and I agree with the above assessment/plan. I have reviewed all available data and have made any necessary changes. Briefly, Mr. Terri Skains looks significantly better than on admission.  The presumed course of events is that pt has a UTI (E coli, pan sensitive according to UC culture from 8/7) to which he developed urosepsis.  He presented with urosepsis, accounting for his tachycardia, leukopenia, fever, hypotension and endo-organ damaged as evidenced by his elevated Cr and LFTs and mildly elevated troponin.   Today, his only complaints are continued headache as well as generalized weakness/fatigue.  He feels like he would like to go home today after his ECHO.  PE: Filed Vitals:   12/16/11 1332  BP: 134/87  Pulse: 89  Temp: 97.9 F (36.6 C)  Resp: 18   Gen: NAD, no longer diaphoretic although skin is warm CV: RRR, no murmus Pulm: clear bilaterally, no wheezes Abd: soft, nontender, no hepatomegaly or RUQ tenderness, no rebound, no ascites   A/P: 31 yo M w/ some PMH of substance abuse presenting with fever, tachycardia to 190s,  diaphoresis, found to have UTI and urosepsis  1. Urosepsis: likely inciting event for all of the patient's presenting symptoms -will give 14 day antibiotic course with Keflex for his pansensitive E coli; will d/c home septra due to liver abnormalities at this time -urine culture he was negative -BP and tachycardia have normalized, pt has been afebrile -consider outpatient imaging due to UTI in otherwise healthy male -will check HIV due to progression of UTI to urosepsis despite adequate outpt abx  2. Tachycardia: resolved with IVF and treatment of UTI  3. Transamininitis: most likely 2/2 end-organ hypoperfusion from hypotension and sepsis  -hepatitis panel pending; hep B and C negative   -LFTs remain significantly elevated -counseled on the importance of avoiding EtOH, tylenol, other hepatotoxic medications   Dispo: d/c home today with f/u at [facility] in 1 week for recheck of CMET  Kye Hedden 12/16/2011, 2:20 PM

## 2011-12-16 NOTE — Discharge Summary (Signed)
Family Medicine Teaching [facility] Discharge Summary  Patient name: Richard Villa Medical record number: [id number] Date of birth: [birth year] Age: 31 y.o. Gender: male Date of Admission: 12/14/2011  Date of Discharge: 12/16/11 Admitting Physician: Tobin Chad, MD  Primary Care Provider: Sheila Oats, MD  Indication for Hospitalization: Urosepsis  Discharge Diagnoses:  1. Urosepsis  2. Sinus Tachycardia 3. Acute Kidney Injury 4. Transaminitis  Consultations: none  Significant Labs and Imaging:  - Outside Urine Culture [facility] 8/8): E.Coli >100,000 colonies.Resistant only to ESBLs  - In house Urine Culture 8/10: No Growth Final  - Admission Labs: WBC 2.7, AST 88, ALT 122, Cr 1.76, UDS negative - Day of Discharge Labs: WBC 6.2, AST 455, ALT 911, Cr 0.82 - Hepatitis Panel: Hep A, B, C negative - HIV: Pending - Echo 12/16/11: Normal, EF 55-60%  Procedures: none  Brief Hospital Course:  Richard Villa was admitted 12/14/11 w/ fever, sinus tachycardia to the 180-190ss, hypotension, and leukopenia to 2.7. He had been diagnosed w/ a UTI 12/12/11 at [facility] 2 days prior and started on bactrim. His presentation was consistent w/ urosepsis and his tachycardia, although minimally responsive to adenosine and metoprolol, responded to IVF and continued treatment of his UTI with ceftriaxone and ampicillin, initially, due to concern for enterococcus. Once culture results from [facility] were obtained which indicated E.Coli, resistant only to ESBLs, his antibiotic regimen was switched to keflex 500mg  PO four times daily. An Echo was obtained to rule out cardiomyopathy given his history of alcohol use and presentation with significant tachycardia, and was unremarkable. His labs also indicated AKI (Cr to 1.76) and transaminitis (AST to 455, ALT to 911) presumably due to hypoperfusion due to sepsis. His Cr trended back to baseline. His LFTs were still elevated at  discharge and need to be followed on an outpatient basis. A hepatitis panel was negative.   Discharge Medications:  Medication List  As of 12/16/2011  4:01 PM   STOP taking these medications         DAYQUIL MULTI-SYMPTOM 60-650-20 MG/30ML Liqd      NYQUIL 60-7.10-02-998 MG/30ML Liqd      sulfamethoxazole-trimethoprim 800-160 MG per tablet         TAKE these medications         cephALEXin 500 MG capsule   Commonly known as: KEFLEX   Take 1 capsule (500 mg total) by mouth 4 (four) times daily.      MELATONIN PO   Take 1 tablet by mouth at bedtime as needed. For sleep      promethazine 25 MG tablet   Commonly known as: PHENERGAN   Take 25 mg by mouth every 6 (six) hours as needed. For nausea/vomiting           Issues for Follow Up: - transaminitis: Presumable etiology is hypoperfusion 2/2 sepsis. Please follow his LFTs on outpatient basis for resolution. Pt was counseled re: abstaining from tylenol and alcohol, pertinent given upcoming bachelors party.  - HIV test: pending at time of discharge - Consider renal ultrasound or CT scan to assess for renal structural abnormalities.  Outstanding Results: HIV test  Discharge Instructions: Please refer to Patient Instructions section of EMR for full details.  Patient was counseled important signs and symptoms that should prompt return to medical care, changes in medications, dietary instructions, activity restrictions, and follow up appointments.    Follow-up Information    Follow up with [facility], MD on 12/27/2011. (at 3:15pm)  Contact information:   [address] [place] [address] [phone]          Discharge Condition: Improved  Erikka Follmer, MD 12/16/2011, 4:01 PM

## 2011-12-17 ENCOUNTER — Telehealth: Payer: Self-pay | Admitting: Family Medicine

## 2011-12-17 NOTE — Telephone Encounter (Signed)
Patient calling because he is concerned that he has a sore throat and wants to make sure there is nothing wrong.  Still with some chills and generalized weakness, but overall feeling much better.  Patient also concerned because he needs to have paper work filled out since he requires short term disability because of the hospitalization.  Pt given clinic fax number [phone]) to have his work fax the papers that need to be signed to [place].  Informed pt I will fill them out tomorrow and fax them back to his employer if fax number is provided.

## 2011-12-17 NOTE — Telephone Encounter (Signed)
Called pt. Pt said, that his d/c summary states 'increase activity slowly'. Please change letter to 'may return to work on Thursday or after..' pt also reports, that he does not feel well at all today and wants to ask Dr.Mcgill some questions about his condition. He has an appt with her on Friday. Letter needs to be faxed to: Attn: Annye Asa at [phone] Fwd. To Dr.McGill to address. Thanks.Arlyss Repress

## 2011-12-17 NOTE — Telephone Encounter (Signed)
Patient is calling with some questions for Dr. Fara Boros.  He also needs a letter for work faxed to his workplace.  He has questions about what needs to be put in the letter as well.

## 2011-12-18 ENCOUNTER — Telehealth: Payer: Self-pay

## 2011-12-18 NOTE — Telephone Encounter (Signed)
Forms dropped off.  Please fax when completed.  Placed in [place] box.

## 2011-12-18 NOTE — Telephone Encounter (Signed)
Completed.  Needs Tax ID #, then can be faxed.  Placed in to-be-faxed with tax ID highlighted for completion.

## 2011-12-27 ENCOUNTER — Ambulatory Visit (INDEPENDENT_AMBULATORY_CARE_PROVIDER_SITE_OTHER): Payer: BC Managed Care – PPO | Admitting: Family Medicine

## 2011-12-27 ENCOUNTER — Encounter: Payer: Self-pay | Admitting: Family Medicine

## 2011-12-27 VITALS — BP 136/79 | HR 65 | Temp 98.6°F | Ht 75.0 in | Wt 254.1 lb

## 2011-12-27 DIAGNOSIS — R7989 Other specified abnormal findings of blood chemistry: Secondary | ICD-10-CM | POA: Insufficient documentation

## 2011-12-27 DIAGNOSIS — A419 Sepsis, unspecified organism: Secondary | ICD-10-CM | POA: Insufficient documentation

## 2011-12-27 DIAGNOSIS — R000 Tachycardia, unspecified: Secondary | ICD-10-CM

## 2011-12-27 DIAGNOSIS — N390 Urinary tract infection, site not specified: Secondary | ICD-10-CM

## 2011-12-27 NOTE — Assessment & Plan Note (Signed)
Check CMET-- if normal, end of work up.  If still elevated but overall improved, f/u for repeat CMET in a few weeks.  If still rising, will likely warrant a referral to hepatologist.  Hepatitis panel and HIV negative.

## 2011-12-27 NOTE — Assessment & Plan Note (Signed)
Normal rate today. No murmur.  Normal ECHO in hospital.

## 2011-12-27 NOTE — Assessment & Plan Note (Signed)
Resolved.  Could consider outpt renal U/S or CT scan to ensure no structural abnormalities, although this it pt's first UTI.

## 2011-12-27 NOTE — Progress Notes (Signed)
S: Pt comes in today for hospital follow up 2/2 urosepsis.  Patient initially presented with significant tachycardia and was found to have elevated Cr and LFTs, likely 2/2 end organ damage from hypoperfusion due to his sepsis.  Prior to d/c, his Cr normalized but his LFTs remained elevated.  Since d/c home, patient has been feeling well.  He completed his 14 day course of Keflex without issues.  No further fevers, still occasionally having sweats.  No dysuria or flank pain.  Does have his bachelor party this weekend and would like to know if he is able to have a beer to 2 on his camping trip.     ROS: Per HPI  History  Smoking status  . Never Smoker   Smokeless tobacco  . Not on file    O:  Filed Vitals:   12/27/11 1525  BP: 136/79  Pulse: 65  Temp: 98.6 F (37 C)    Gen: NAD CV: RRR, no murmur Pulm: CTA bilat, no wheezes or crackles Abd: soft, NT, + BS, no CVA tenderness Ext: Warm, no chronic skin changes, no edema   A/P: 31 y.o. male p/w  HFU for urosepsis -See problem list -f/u PRN

## 2011-12-27 NOTE — Patient Instructions (Signed)
It was good to see you today!  Have fun this weekend, but not too much fun.  We will recheck your liver enzymes today.  I might have the results back tomorrow morning.  If there is not an improvement in your liver, I will get in touch with you then.  Otherwise, I will call Sunday or Monday and send a letter.  Come back if you need anything!

## 2011-12-28 ENCOUNTER — Encounter: Payer: Self-pay | Admitting: Family Medicine

## 2011-12-28 LAB — COMPREHENSIVE METABOLIC PANEL
ALT: 70 U/L — ABNORMAL HIGH (ref 0–53)
AST: 26 U/L (ref 0–37)
Creat: 1.11 mg/dL (ref 0.50–1.35)
Total Bilirubin: 0.5 mg/dL (ref 0.3–1.2)

## 2011-12-30 ENCOUNTER — Telehealth: Payer: Self-pay | Admitting: Family Medicine

## 2011-12-30 DIAGNOSIS — R7989 Other specified abnormal findings of blood chemistry: Secondary | ICD-10-CM

## 2011-12-30 NOTE — Telephone Encounter (Signed)
Left message for patient to return call. Please have him schedule a lab visit to repeat blood work between 01/20/12 and 01/24/12.Mckynzi Cammon, Rodena Medin

## 2011-12-30 NOTE — Telephone Encounter (Signed)
Please call pt and let him know that his liver function is almost back to normal.  We will repeat the test in 3-4 weeks.  He can make a lab only appointment.  I have placed the future order already. Thanks!

## 2011-12-30 NOTE — Telephone Encounter (Signed)
Patient returned call and lab appointment was scheduled for 01/24/12.Richard Villa, Richard Villa

## 2012-01-02 ENCOUNTER — Ambulatory Visit: Payer: BC Managed Care – PPO | Admitting: Family Medicine

## 2012-01-24 ENCOUNTER — Other Ambulatory Visit: Payer: BC Managed Care – PPO

## 2012-01-24 DIAGNOSIS — R7989 Other specified abnormal findings of blood chemistry: Secondary | ICD-10-CM

## 2012-01-24 NOTE — Progress Notes (Signed)
CMP DONE TODAY [place] 

## 2012-01-25 LAB — COMPREHENSIVE METABOLIC PANEL
CO2: 28 mEq/L (ref 19–32)
Creat: 0.87 mg/dL (ref 0.50–1.35)
Glucose, Bld: 83 mg/dL (ref 70–99)
Total Bilirubin: 0.6 mg/dL (ref 0.3–1.2)
Total Protein: 7.1 g/dL (ref 6.0–8.3)

## 2012-01-26 ENCOUNTER — Encounter: Payer: Self-pay | Admitting: Family Medicine

## 2012-03-12 ENCOUNTER — Other Ambulatory Visit: Payer: Self-pay | Admitting: Chiropractic Medicine

## 2012-03-12 DIAGNOSIS — M545 Low back pain, unspecified: Secondary | ICD-10-CM

## 2012-03-18 ENCOUNTER — Ambulatory Visit
Admission: RE | Admit: 2012-03-18 | Discharge: 2012-03-18 | Disposition: A | Discharge status: Home / Self Care | Payer: BC Managed Care – PPO | Source: Ambulatory Visit | Attending: Chiropractic Medicine | Admitting: Chiropractic Medicine

## 2012-03-18 DIAGNOSIS — M545 Low back pain, unspecified: Secondary | ICD-10-CM

## 2013-04-14 ENCOUNTER — Telehealth: Payer: Self-pay | Admitting: Family Medicine

## 2013-04-14 NOTE — Telephone Encounter (Signed)
Called pt. LMVM to call back. Please tell pt to schedule OV with Dr.Williamson in order to fill out the form. Thank you. Lorenda Hatchet, Prabhleen Montemayor Last OV with Dr.McGill 12/27/11.  Baxter Hire and I have the form at the nurses station)

## 2013-04-14 NOTE — Telephone Encounter (Signed)
I agree with nursing advice. Pt has only been seen once in our clinic before for a hospital follow in August 2013. I cannot completed forms without a visit.

## 2013-04-14 NOTE — Telephone Encounter (Signed)
Papers dropped off to be filled out for Richard Villa.  Please mail in the attached envelope.

## 2013-04-15 ENCOUNTER — Telehealth: Payer: Self-pay | Admitting: Family Medicine

## 2013-04-15 NOTE — Telephone Encounter (Signed)
Will fwd to PCP and nursing director for advice since we are not pt's PCP.  Shaheem Pichon, Darlyne Russian, CMA

## 2013-04-15 NOTE — Telephone Encounter (Signed)
Please call the patient and give him these two options:   1. Schedule an appointment with any doctor (even same day) just to have the form completed 2. Give [place] the fax number to his current MD, so we can fax his discharge summer there and that MD can complete it. Make sure to note verbal consent on the phone if the patient agrees to this.

## 2013-04-15 NOTE — Telephone Encounter (Signed)
Pt called back and wants to explain the form that he dropped off. He needs his past history of the hospital stay under the care of Dr. Fara Boros. He is getting life insurance and they need to know the outcome of his hospital stay not his current health. He has a PCP, but this doctor can not fill out the forms since he did not see him during that time frame. jw

## 2013-04-16 NOTE — Telephone Encounter (Signed)
Called pt and left msg on identifiable voice mail with options per Dr. Clinton Sawyer, see below.  Will await pt's return phone call.  Lee Kuang, Darlyne Russian, CMA

## 2013-04-20 ENCOUNTER — Encounter: Payer: Self-pay | Admitting: Family Medicine

## 2013-04-20 ENCOUNTER — Ambulatory Visit (INDEPENDENT_AMBULATORY_CARE_PROVIDER_SITE_OTHER): Payer: BC Managed Care – PPO | Admitting: Family Medicine

## 2013-04-20 ENCOUNTER — Encounter: Payer: BC Managed Care – PPO | Admitting: Family Medicine

## 2013-04-20 VITALS — BP 127/75 | HR 73 | Temp 98.4°F | Wt 260.0 lb

## 2013-04-20 DIAGNOSIS — Z0289 Encounter for other administrative examinations: Secondary | ICD-10-CM

## 2013-04-20 DIAGNOSIS — A419 Sepsis, unspecified organism: Secondary | ICD-10-CM

## 2013-04-21 NOTE — Progress Notes (Signed)
  Tana Conch, MD Phone: [phone]  Subjective:  Chief complaint-noted  Follow up for urosepsis Patient treated for urosepsis on our inpatient service a year ago. Patient had a urinary infection leading to tachycardia, hypotension as a result of his illness which ultimately resulted in elevated LFTs, AKI. Patient was treated in house with antibiotics and later discharged on Keflex. He last followed up in clinic about a year ago and LFTs and kidney function had normalized. He presents today to request form completion related to this hospitalization. He is planning to establish with [facility] ROS-Denies nausea/vomiting/fever/chills/fatigue/overall sick feelings.    Past Medical History-urosepsis, elevated LFTs,  Social history-nonsmoker, drinks 6 beers each Friday (advised against this given history elevated LFTs-max 2 per each time he drinks)  Medications- reviewed and updated Current Outpatient Prescriptions  Medication Sig Dispense Refill  . Nutritional Supplements (MELATONIN PO) Take 1 tablet by mouth at bedtime as needed. For sleep       No current facility-administered medications for this visit.    Objective: BP 127/75  Pulse 73  Temp(Src) 98.4 F (36.9 C) (Oral)  Wt 260 lb (117.935 kg) Gen: NAD, resting comfortably CV: RRR no murmurs rubs or gallops Lungs: CTAB no crackles, wheeze, rhonchi Abdomen: soft/nontender/nondistended/normal bowel sounds. No rebound or guarding.  Ext: no edema Skin: warm, dry Neuro: grossly normal, moves all extremities  Assessment/Plan:  Urosepsis Completely resolved. No recurrence of symptoms. If recurred, would consider renal ultrasound or CT to evaluate anatomy. Completed forms for life insurance per patient request.

## 2013-04-21 NOTE — Assessment & Plan Note (Addendum)
Completely resolved. No recurrence of symptoms. If recurred, would consider renal ultrasound or CT to evaluate anatomy. Completed forms for life insurance per patient request.

## 2013-05-12 NOTE — Telephone Encounter (Signed)
Pt had office visit with Dr. Yong Channel on 121/16/2014, forms were completed.  Pt plans to establish care with [facility]. Tamika L. Hassell Done, RN

## 2014-02-01 ENCOUNTER — Other Ambulatory Visit: Payer: Self-pay | Admitting: Chiropractic Medicine

## 2014-02-01 DIAGNOSIS — M545 Low back pain, unspecified: Secondary | ICD-10-CM

## 2014-02-01 DIAGNOSIS — M999 Biomechanical lesion, unspecified: Secondary | ICD-10-CM

## 2014-02-01 DIAGNOSIS — M5430 Sciatica, unspecified side: Secondary | ICD-10-CM

## 2014-02-03 ENCOUNTER — Other Ambulatory Visit: Payer: BC Managed Care – PPO

## 2014-02-08 ENCOUNTER — Ambulatory Visit
Admission: RE | Admit: 2014-02-08 | Discharge: 2014-02-08 | Disposition: A | Discharge status: Home / Self Care | Payer: BC Managed Care – PPO | Source: Ambulatory Visit | Attending: Chiropractic Medicine | Admitting: Chiropractic Medicine

## 2014-02-08 DIAGNOSIS — M545 Low back pain, unspecified: Secondary | ICD-10-CM

## 2014-02-08 DIAGNOSIS — M999 Biomechanical lesion, unspecified: Secondary | ICD-10-CM

## 2014-02-08 DIAGNOSIS — M5430 Sciatica, unspecified side: Secondary | ICD-10-CM

## 2017-05-09 DIAGNOSIS — Z719 Counseling, unspecified: Secondary | ICD-10-CM | POA: Diagnosis not present

## 2017-05-22 DIAGNOSIS — R718 Other abnormality of red blood cells: Secondary | ICD-10-CM | POA: Diagnosis not present

## 2017-05-22 DIAGNOSIS — Z0000 Encounter for general adult medical examination without abnormal findings: Secondary | ICD-10-CM | POA: Diagnosis not present

## 2017-07-01 DIAGNOSIS — M9901 Segmental and somatic dysfunction of cervical region: Secondary | ICD-10-CM | POA: Diagnosis not present

## 2017-09-30 DIAGNOSIS — M9901 Segmental and somatic dysfunction of cervical region: Secondary | ICD-10-CM | POA: Diagnosis not present

## 2018-04-07 DIAGNOSIS — Z713 Dietary counseling and surveillance: Secondary | ICD-10-CM | POA: Diagnosis not present

## 2018-04-13 DIAGNOSIS — D2262 Melanocytic nevi of left upper limb, including shoulder: Secondary | ICD-10-CM | POA: Diagnosis not present

## 2018-04-13 DIAGNOSIS — D2271 Melanocytic nevi of right lower limb, including hip: Secondary | ICD-10-CM | POA: Diagnosis not present

## 2018-04-13 DIAGNOSIS — D485 Neoplasm of uncertain behavior of skin: Secondary | ICD-10-CM | POA: Diagnosis not present

## 2018-04-13 DIAGNOSIS — D2261 Melanocytic nevi of right upper limb, including shoulder: Secondary | ICD-10-CM | POA: Diagnosis not present

## 2018-04-13 DIAGNOSIS — L812 Freckles: Secondary | ICD-10-CM | POA: Diagnosis not present

## 2018-05-08 DIAGNOSIS — J189 Pneumonia, unspecified organism: Secondary | ICD-10-CM | POA: Diagnosis not present

## 2018-05-17 DIAGNOSIS — R05 Cough: Secondary | ICD-10-CM | POA: Diagnosis not present

## 2018-06-01 DIAGNOSIS — Z13220 Encounter for screening for lipoid disorders: Secondary | ICD-10-CM | POA: Diagnosis not present

## 2018-06-01 DIAGNOSIS — Z0000 Encounter for general adult medical examination without abnormal findings: Secondary | ICD-10-CM | POA: Diagnosis not present

## 2018-06-18 DIAGNOSIS — D485 Neoplasm of uncertain behavior of skin: Secondary | ICD-10-CM | POA: Diagnosis not present

## 2018-06-18 DIAGNOSIS — L905 Scar conditions and fibrosis of skin: Secondary | ICD-10-CM | POA: Diagnosis not present

## 2018-06-29 DIAGNOSIS — J029 Acute pharyngitis, unspecified: Secondary | ICD-10-CM | POA: Diagnosis not present

## 2018-06-29 DIAGNOSIS — B349 Viral infection, unspecified: Secondary | ICD-10-CM | POA: Diagnosis not present

## 2020-11-06 ENCOUNTER — Emergency Department (HOSPITAL_BASED_OUTPATIENT_CLINIC_OR_DEPARTMENT_OTHER): Payer: 59

## 2020-11-06 ENCOUNTER — Emergency Department (HOSPITAL_BASED_OUTPATIENT_CLINIC_OR_DEPARTMENT_OTHER)
Admission: EM | Admit: 2020-11-06 | Discharge: 2020-11-06 | Disposition: A | Discharge status: Home / Self Care | Payer: 59 | Attending: Emergency Medicine | Admitting: Emergency Medicine

## 2020-11-06 ENCOUNTER — Other Ambulatory Visit: Payer: Self-pay

## 2020-11-06 ENCOUNTER — Encounter (HOSPITAL_BASED_OUTPATIENT_CLINIC_OR_DEPARTMENT_OTHER): Payer: Self-pay | Admitting: *Deleted

## 2020-11-06 DIAGNOSIS — Z79899 Other long term (current) drug therapy: Secondary | ICD-10-CM | POA: Diagnosis not present

## 2020-11-06 DIAGNOSIS — K760 Fatty (change of) liver, not elsewhere classified: Secondary | ICD-10-CM | POA: Insufficient documentation

## 2020-11-06 DIAGNOSIS — D72829 Elevated white blood cell count, unspecified: Secondary | ICD-10-CM | POA: Insufficient documentation

## 2020-11-06 DIAGNOSIS — N3001 Acute cystitis with hematuria: Secondary | ICD-10-CM

## 2020-11-06 DIAGNOSIS — R350 Frequency of micturition: Secondary | ICD-10-CM | POA: Diagnosis not present

## 2020-11-06 DIAGNOSIS — M545 Low back pain, unspecified: Secondary | ICD-10-CM | POA: Insufficient documentation

## 2020-11-06 DIAGNOSIS — I10 Essential (primary) hypertension: Secondary | ICD-10-CM | POA: Diagnosis not present

## 2020-11-06 HISTORY — DX: Essential (primary) hypertension: I10

## 2020-11-06 HISTORY — DX: Urinary tract infection, site not specified: N39.0

## 2020-11-06 HISTORY — DX: Obesity, unspecified: E66.9

## 2020-11-06 LAB — BASIC METABOLIC PANEL
Anion gap: 11 (ref 5–15)
BUN: 16 mg/dL (ref 6–20)
CO2: 25 mmol/L (ref 22–32)
Calcium: 9.5 mg/dL (ref 8.9–10.3)
Chloride: 103 mmol/L (ref 98–111)
Creatinine, Ser: 1 mg/dL (ref 0.61–1.24)
GFR, Estimated: >60 mL/min (ref >60)
Glucose, Bld: 119 mg/dL — ABNORMAL HIGH (ref 70–99)
Potassium: 3.9 mmol/L (ref 3.5–5.1)
Sodium: 139 mmol/L (ref 135–145)

## 2020-11-06 LAB — CBC WITH DIFFERENTIAL/PLATELET
Abs Immature Granulocytes: 0.04 10*3/uL (ref 0.00–0.07)
Basophils Absolute: 0 10*3/uL (ref 0.0–0.1)
Basophils Relative: 0 %
Eosinophils Absolute: 0 10*3/uL (ref 0.0–0.5)
Eosinophils Relative: 0 %
HCT: 41.2 % (ref 39.0–52.0)
Hemoglobin: 13 g/dL (ref 13.0–17.0)
Immature Granulocytes: 0 %
Lymphocytes Relative: 12 %
Lymphs Abs: 1.4 10*3/uL (ref 0.7–4.0)
MCH: 22.4 pg — ABNORMAL LOW (ref 26.0–34.0)
MCHC: 31.6 g/dL (ref 30.0–36.0)
MCV: 71 fL — ABNORMAL LOW (ref 80.0–100.0)
Monocytes Absolute: 0.7 10*3/uL (ref 0.1–1.0)
Monocytes Relative: 6 %
Neutro Abs: 9.2 10*3/uL — ABNORMAL HIGH (ref 1.7–7.7)
Neutrophils Relative %: 82 %
Platelets: 207 10*3/uL (ref 150–400)
RBC: 5.8 MIL/uL (ref 4.22–5.81)
RDW: 16.4 % — ABNORMAL HIGH (ref 11.5–15.5)
WBC: 11.3 10*3/uL — ABNORMAL HIGH (ref 4.0–10.5)
nRBC: 0 % (ref 0.0–0.2)

## 2020-11-06 LAB — URINALYSIS, ROUTINE W REFLEX MICROSCOPIC
Bilirubin Urine: NEGATIVE
Glucose, UA: NEGATIVE mg/dL
Ketones, ur: NEGATIVE mg/dL
Nitrite: NEGATIVE
Protein, ur: NEGATIVE mg/dL
Specific Gravity, Urine: 1.018 (ref 1.005–1.030)
pH: 5.5 (ref 5.0–8.0)

## 2020-11-06 MED ORDER — SODIUM CHLORIDE 0.9 % IV BOLUS
1000.0000 mL | Freq: Once | INTRAVENOUS | Status: AC
  Administered 2020-11-06: 1000 mL via INTRAVENOUS

## 2020-11-06 MED ORDER — CEPHALEXIN 500 MG PO CAPS: 500.0000 mg | ORAL_CAPSULE | Freq: Four times a day (QID) | ORAL | 0 refills | Status: AC

## 2020-11-06 MED ORDER — SODIUM CHLORIDE 0.9 % IV SOLN: INTRAVENOUS | Status: DC

## 2020-11-06 MED ORDER — SODIUM CHLORIDE 0.9 % IV SOLN
1.0000 g | Freq: Once | INTRAVENOUS | Status: AC
  Administered 2020-11-06: 1 g via INTRAVENOUS
  Filled 2020-11-06: qty 10

## 2020-11-06 NOTE — ED Triage Notes (Signed)
[facility] with urinary frequency, burning, body aches and chills.

## 2020-11-06 NOTE — ED Notes (Signed)
Patient states unable to void at this time.  Will continue to monitor.

## 2020-11-06 NOTE — Discharge Instructions (Addendum)
Urinalysis here not totally consistent with urinary tract infection.  But based on your symptoms and past history urine culture is sent and pending.  We will treat with Keflex.  Received a dose of Rocephin here.  Make an appointment to follow-up with your primary care doctor as well as urology.  Return for any new or worse symptoms.

## 2020-11-06 NOTE — ED Provider Notes (Signed)
[facility] EMERGENCY DEPT Provider Note   CSN: [id number] Arrival date & time: 11/06/20  2831     History Chief Complaint  Patient presents with   Urinary Symptoms    Richard Villa is a 40 y.o. male.  Patient sent in from urgent care.  Patient yesterday started with urinary frequency burning body aches and chills.  In 2013 patient had a fairly significant urinary tract infection that led to hospitalization.  Patient states that he is got burning with urination body aches some lower back pain.  And chills.  No incontinence.  No radiation of any pain into the legs.  No numbness or weakness in the feet.  Also no upper respiratory symptoms.      Past Medical History:  Diagnosis Date   Hypertension    Obesity    UTI (urinary tract infection)     Patient Active Problem List   Diagnosis Date Noted   Urosepsis 12/27/2011   Elevated LFTs 12/27/2011   Tachycardia 12/14/2011   UTI (urinary tract infection) 12/14/2011   Shaking 12/14/2011    Past Surgical History:  Procedure Laterality Date   FOOT SURGERY         No family history on file.  Social History   Tobacco Use   Smoking status: Never   Smokeless tobacco: Never  Vaping Use   Vaping Use: Never used  Substance Use Topics   Alcohol use: Yes    Alcohol/week: 12.0 standard drinks    Types: 12 Cans of beer per week   Drug use: Never    Home Medications Prior to Admission medications   Medication Sig Start Date End Date Taking? Authorizing Provider  lisinopril (ZESTRIL) 20 MG tablet Take 20 mg by mouth daily.   Yes [provider]  sertraline (ZOLOFT) 100 MG tablet Take 100 mg by mouth daily.   Yes [provider]  Nutritional Supplements (MELATONIN PO) Take 1 tablet by mouth at bedtime as needed. For sleep    [provider]    Allergies    Toradol [ketorolac tromethamine]  Review of Systems   Review of Systems  Constitutional:  Positive for chills. Negative for  fever.  HENT:  Negative for ear pain and sore throat.   Eyes:  Negative for pain and visual disturbance.  Respiratory:  Negative for cough and shortness of breath.   Cardiovascular:  Negative for chest pain and palpitations.  Gastrointestinal:  Negative for abdominal pain and vomiting.  Genitourinary:  Positive for dysuria. Negative for hematuria.  Musculoskeletal:  Positive for back pain and myalgias. Negative for arthralgias.  Skin:  Negative for color change and rash.  Neurological:  Negative for seizures and syncope.  Psychiatric/Behavioral:  Negative for confusion.   All other systems reviewed and are negative.  Physical Exam Updated Vital Signs BP (!) 153/109 (BP Location: Right Arm)   Pulse (!) 112   Temp 98.8 F (37.1 C) (Oral)   Resp 18   Ht 1.905 m (6\' 3" )   Wt 127 kg   BMI 35.00 kg/m   Physical Exam Vitals and nursing note reviewed.  Constitutional:      General: He is not in acute distress.    Appearance: He is well-developed. He is not ill-appearing.  HENT:     Head: Normocephalic and atraumatic.  Eyes:     Extraocular Movements: Extraocular movements intact.     Conjunctiva/sclera: Conjunctivae normal.     Pupils: Pupils are equal, round, and reactive to light.  Cardiovascular:  Rate and Rhythm: Normal rate and regular rhythm.     Heart sounds: No murmur heard. Pulmonary:     Effort: Pulmonary effort is normal. No respiratory distress.     Breath sounds: Normal breath sounds.  Abdominal:     General: There is no distension.     Palpations: Abdomen is soft.     Tenderness: There is no abdominal tenderness. There is no guarding.  Musculoskeletal:        General: No swelling. Normal range of motion.     Cervical back: Neck supple.  Skin:    General: Skin is warm and dry.     Capillary Refill: Capillary refill takes less than 2 seconds.  Neurological:     Mental Status: He is alert.    ED Results / Procedures / Treatments   Labs (all labs ordered  are listed, but only abnormal results are displayed) Labs Reviewed  URINALYSIS, ROUTINE W REFLEX MICROSCOPIC    EKG None  Radiology No results found.  Procedures Procedures     Medications Ordered in ED Medications - No data to display  ED Course  I have reviewed the triage vital signs and the nursing notes.  Pertinent labs & imaging results that were available during my care of the patient were reviewed by me and considered in my medical decision making (see chart for details).    MDM Rules/Calculators/A&P                          Work-up here urinalysis not totally consistent with urinary tract infection but based on his past history we will treat received 1 g Rocephin here and will continue on Keflex we will have him follow-up with urology as well as primary care doctor.  Labs mild leukocytosis with a white blood cell count 11.3.  Basic metabolic panel is normal except for glucose of 119.  Patient nontoxic no acute distress here.  No fever here. Final Clinical Impression(s) / ED Diagnoses Final diagnoses:  None    Rx / DC Orders ED Discharge Orders     None        Fredia Sorrow, MD 11/06/20 1151

## 2020-11-09 LAB — URINE CULTURE: Culture: 10000 — AB

## 2020-11-10 ENCOUNTER — Telehealth: Payer: Self-pay | Admitting: Emergency Medicine

## 2020-11-10 NOTE — Telephone Encounter (Signed)
Post ED Visit - Positive Culture Follow-up  Culture report reviewed by antimicrobial stewardship pharmacist: [place] Team []  Elenor Quinones, Pharm.D. []  Heide Guile, Pharm.D., BCPS AQ-ID []  Parks Neptune, Pharm.D., BCPS []  Alycia Rossetti, Pharm.D., BCPS []  [place], Pharm.D., BCPS, AAHIVP []  Legrand Como, Pharm.D., BCPS, AAHIVP []  Salome Arnt, PharmD, BCPS []  Johnnette Gourd, PharmD, BCPS []  Hughes Better, PharmD, BCPS [x]  Joetta Manners, PharmD []  Laqueta Linden, PharmD, BCPS []  Albertina Parr, PharmD  [place] Team []  Leodis Sias, PharmD []  Lindell Spar, PharmD []  Royetta Asal, PharmD []  Graylin Shiver, Rph []  Rema Fendt) Glennon Mac, PharmD []  Arlyn Dunning, PharmD []  Netta Cedars, PharmD []  Dia Sitter, PharmD []  Leone Haven, PharmD []  Gretta Arab, PharmD []  Theodis Shove, PharmD []  Peggyann Juba, PharmD []  Reuel Boom, PharmD   Positive urine culture Treated with Cephalexin, organism sensitive to the same and no further patient follow-up is required at this time.  Sandi Raveling Christell Steinmiller 11/10/2020, 10:13 AM

## 2021-12-27 IMAGING — CT CT RENAL STONE PROTOCOL
2 of 4 series · 16 of 46 positions shown, 18 images · non-contrast
Comparison: None.

CLINICAL DATA: Urinary frequency, burning, body aches and chills.
Also reported flank pain.

EXAM:
CT ABDOMEN AND PELVIS WITHOUT CONTRAST
TECHNIQUE: Multidetector CT imaging of the abdomen and pelvis was performed
following the standard protocol without IV contrast.

[Series 2: stone full · axial · 0.93mm/px · z∈[+986,+1471]mm · 13 of 107 slices shown, 15 images]
[im 5/107  soft-tissue]
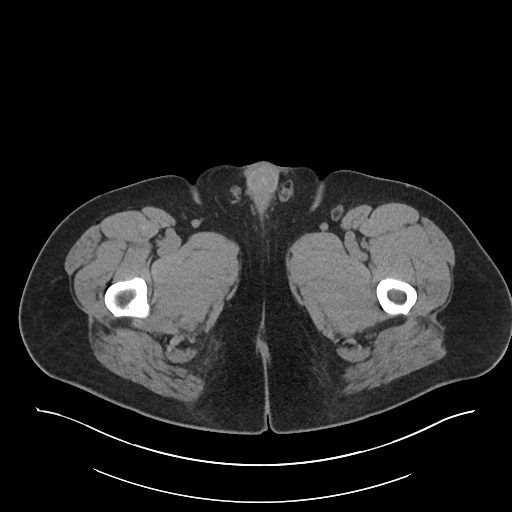
[im 5/107  bone]
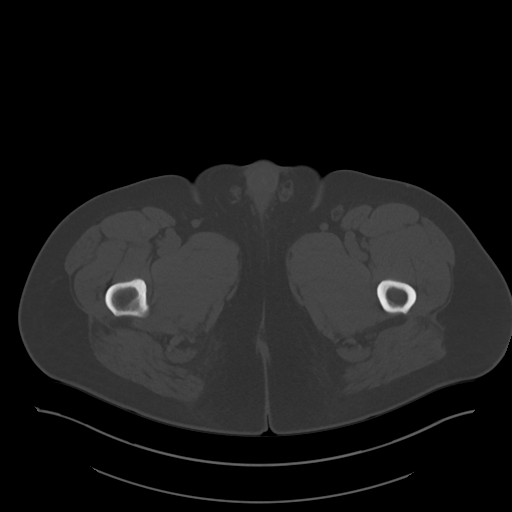
[im 14/107  soft-tissue]
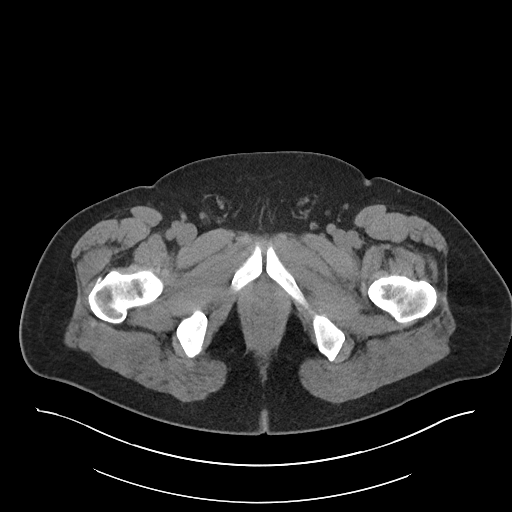
[im 23/107  soft-tissue]
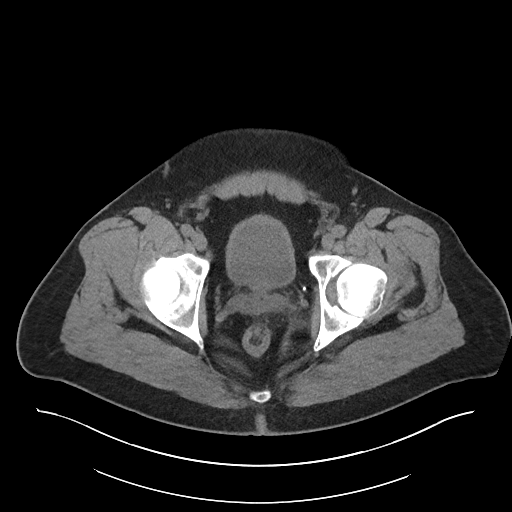
[im 31/107  soft-tissue]
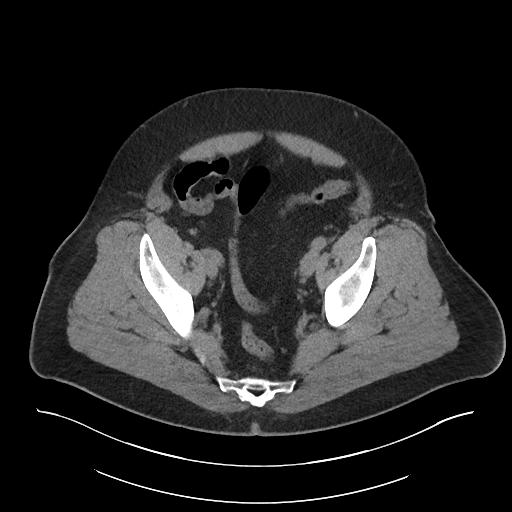
[im 36/107  soft-tissue]
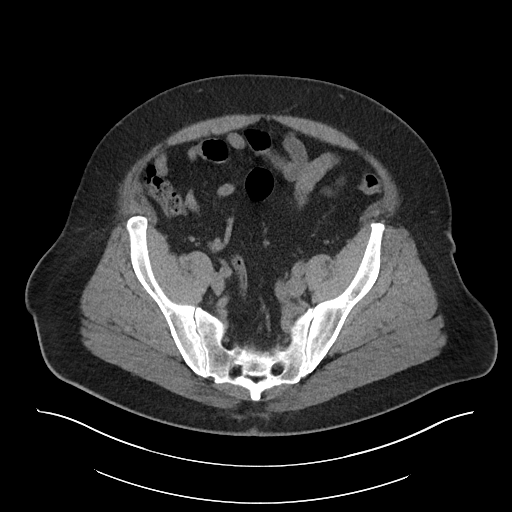
[im 45/107  soft-tissue]
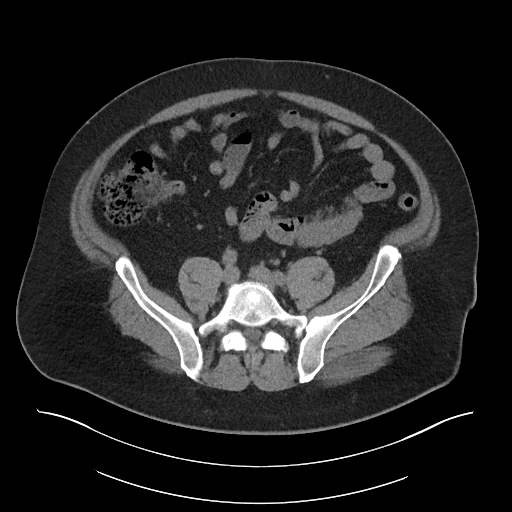
[im 54/107  soft-tissue]
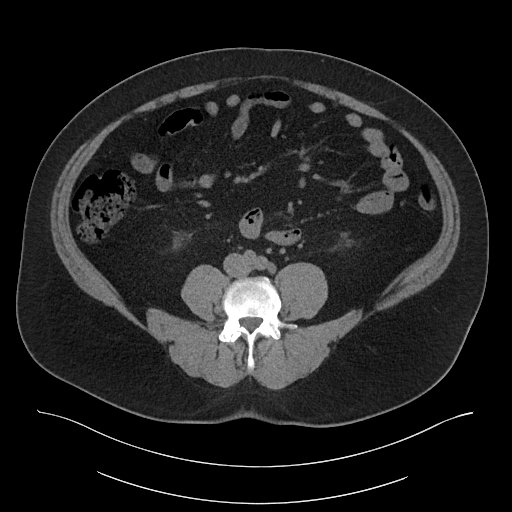
[im 62/107  soft-tissue]
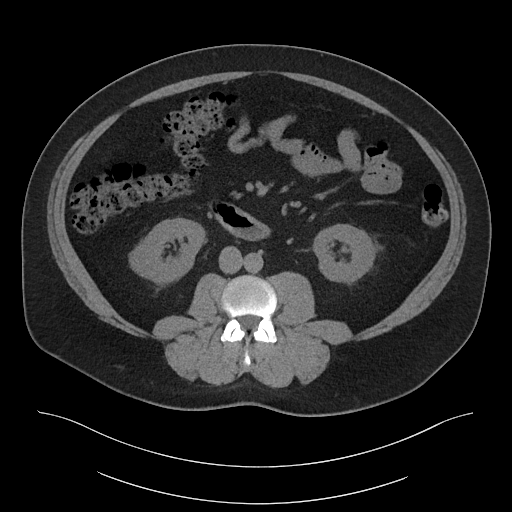
[im 71/107  soft-tissue]
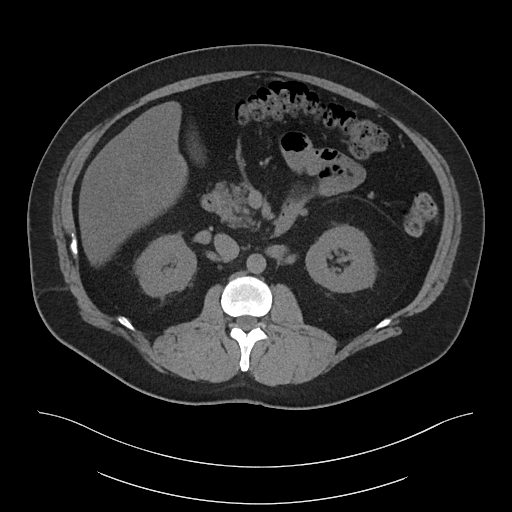
[im 71/107  bone]
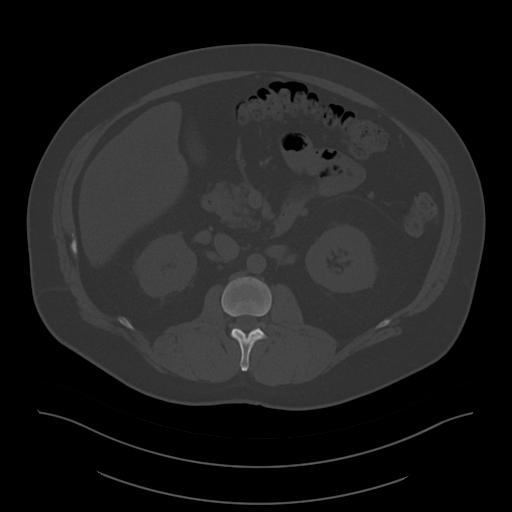
[im 76/107  soft-tissue]
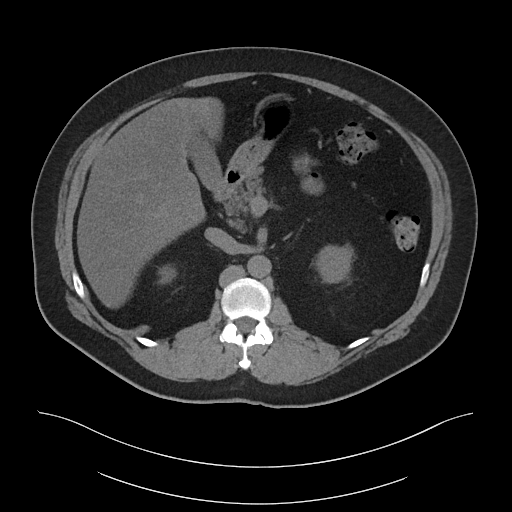
[im 84/107  soft-tissue]
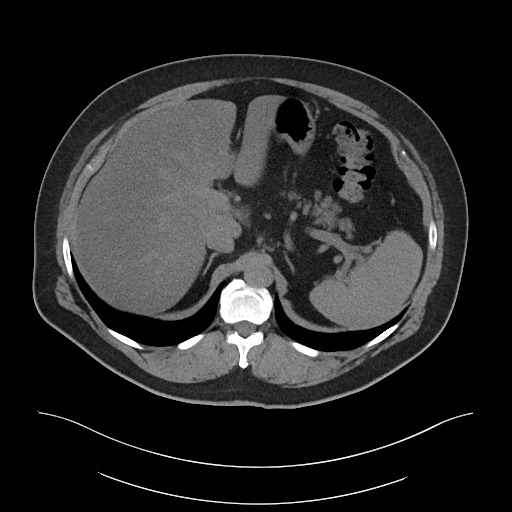
[im 93/107  soft-tissue]
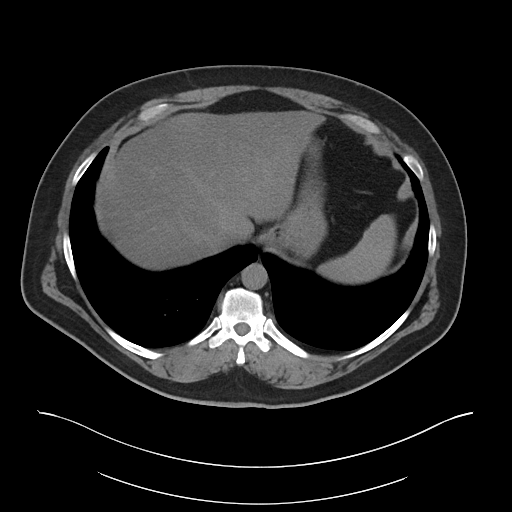
[im 102/107  soft-tissue]
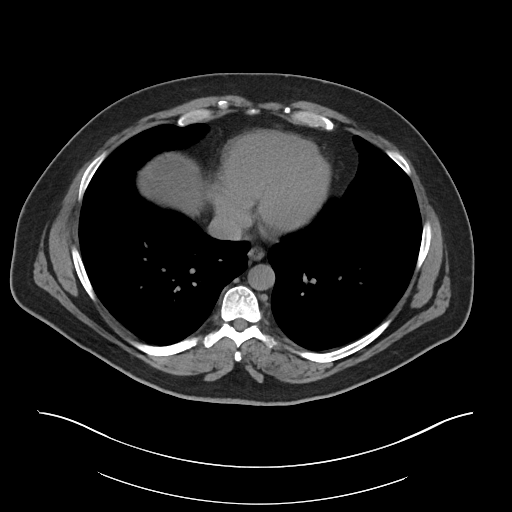

[Series 5: coronal · coronal · 0.91mm/px · 3 of 135 slices shown]
[im 45/135  soft-tissue]
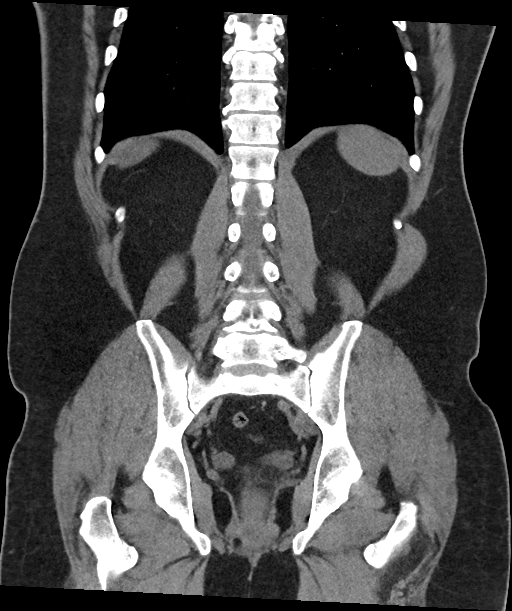
[im 60/135  soft-tissue]
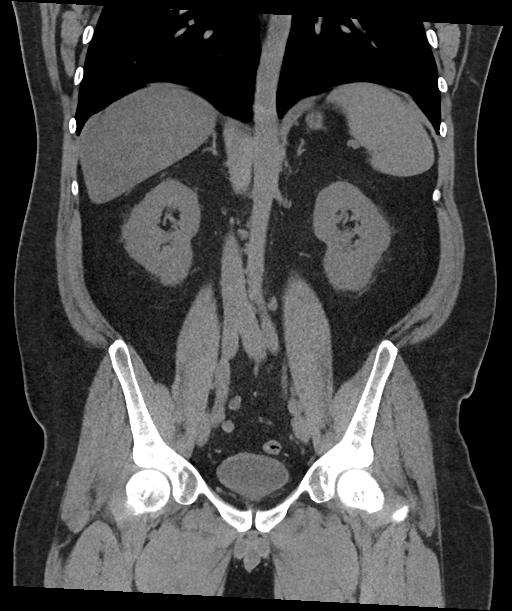
[im 75/135  soft-tissue]
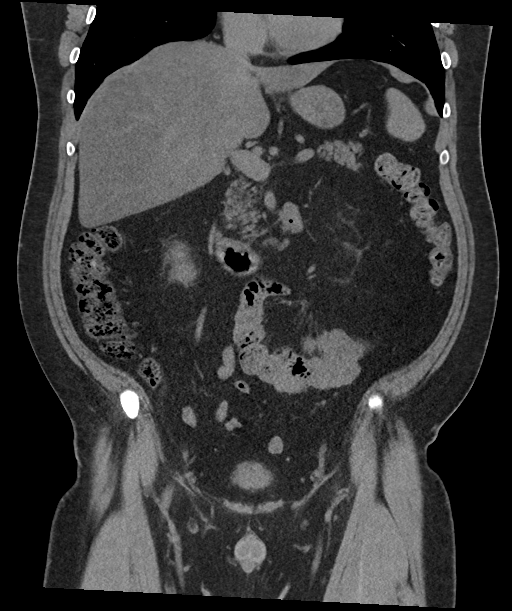

[16 of 46 positions shown; findings below may reference images not displayed]

FINDINGS: Lower chest: Clear lung bases.  Heart is normal in size.

Hepatobiliary: Decreased attenuation of the liver consistent with
fatty infiltration. Liver normal in size. No mass or focal lesion.
Normal gallbladder. No bile duct dilation.

Pancreas: Unremarkable. No pancreatic ductal dilatation or
surrounding inflammatory changes.

Spleen: Normal in size without focal abnormality.

Adrenals/Urinary Tract: Normal adrenal glands.

Kidneys normal in size, orientation and position. No renal masses,
stones or hydronephrosis. Normal ureters. Bladder is unremarkable.

Stomach/Bowel: Stomach is within normal limits. Appendix appears
normal. No evidence of bowel wall thickening, distention, or
inflammatory changes.

Vascular/Lymphatic: No significant vascular findings are present. No
enlarged abdominal or pelvic lymph nodes.

Reproductive: Unremarkable.

Other: Small fat containing umbilical hernia.  No ascites.

Musculoskeletal: No fracture or acute finding.  No bone lesion.
IMPRESSION: 1. No acute findings within the abdomen or pelvis. No renal or
ureteral stones or obstructive uropathy.
2. Hepatic steatosis.

## 2023-01-03 ENCOUNTER — Encounter: Payer: Self-pay | Admitting: Podiatry

## 2023-01-03 ENCOUNTER — Ambulatory Visit (INDEPENDENT_AMBULATORY_CARE_PROVIDER_SITE_OTHER): Payer: 59

## 2023-01-03 ENCOUNTER — Ambulatory Visit (INDEPENDENT_AMBULATORY_CARE_PROVIDER_SITE_OTHER): Payer: 59 | Admitting: Podiatry

## 2023-01-03 DIAGNOSIS — M7752 Other enthesopathy of left foot: Secondary | ICD-10-CM

## 2023-01-03 DIAGNOSIS — M7751 Other enthesopathy of right foot: Secondary | ICD-10-CM | POA: Diagnosis not present

## 2023-01-03 DIAGNOSIS — Q6689 Other  specified congenital deformities of feet: Secondary | ICD-10-CM

## 2023-01-03 MED ORDER — TRIAMCINOLONE ACETONIDE 10 MG/ML IJ SUSP
10.0000 mg | Freq: Once | INTRAMUSCULAR | Status: AC
  Administered 2023-01-03: 10 mg via INTRA_ARTICULAR

## 2023-01-03 NOTE — Progress Notes (Signed)
Subjective:   Patient ID: Richard Villa, male   DOB: 42 y.o.   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  He does state ambulatory he is taking Ozempic and has lost weight and has sugar that is slightly elevated.  He wants to be active does not smoke   Review of Systems  All other systems reviewed and are negative.       Objective:  Physical Exam Vitals and nursing note reviewed.  Constitutional:      Appearance: He is well-developed.  Pulmonary:     Effort: Pulmonary effort is normal.  Musculoskeletal:        General: Normal range of motion.  Skin:    General: Skin is warm.  Neurological:     Mental Status: He is alert.     Neurovascular status found to be intact muscle strength found to be adequate he actually has relatively good motion of the subtalar ankle joint despite appropriate deformity right.  Does have inflammation sinus tarsi bilateral and gives a history of uric acid with gout flareups that were very prevalent but have reduced recently     Assessment:  Difficult to distinguish between inflammatory capsulitis between the possibility for gout bilateral     Plan:  H&P reviewed discussed at great length.  I do think custom orthotics to try to take some of the stress off the clubfoot deformity would be of value for this patient and patient wants to have these made and at this point we will see pedorthist for fitting and for casting.  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

## 2023-01-21 ENCOUNTER — Ambulatory Visit: Payer: 59

## 2023-01-21 NOTE — Progress Notes (Signed)
  Patient was seen, measured for custom molded foot orthotics  Patient will benefit from CFO's as they will help provide total contact to MLA's helping to better distribute body weight across BIL feet greater reducing plantar pressure and pain and to also encourage FF and RF alignment  Patient was scanned items to be ordered and fit when in  [employer], CFo, CFm

## 2023-02-26 ENCOUNTER — Ambulatory Visit: Payer: 59

## 2023-02-26 DIAGNOSIS — M21541 Acquired clubfoot, right foot: Secondary | ICD-10-CM | POA: Diagnosis not present

## 2023-02-26 DIAGNOSIS — M7752 Other enthesopathy of left foot: Secondary | ICD-10-CM

## 2023-02-26 DIAGNOSIS — M21542 Acquired clubfoot, left foot: Secondary | ICD-10-CM | POA: Diagnosis not present

## 2023-02-26 DIAGNOSIS — M7751 Other enthesopathy of right foot: Secondary | ICD-10-CM | POA: Diagnosis not present

## 2023-02-26 DIAGNOSIS — Q6689 Other  specified congenital deformities of feet: Secondary | ICD-10-CM

## 2023-02-26 NOTE — Progress Notes (Signed)
Patient presents today to pick up custom molded foot orthotics, diagnosed with Club foot by Dr. Charlsie Merles .   Orthotics were dispensed and fit was satisfactory. Reviewed instructions for break-in and wear. Written instructions given to patient.  Patient will follow up as needed.  Richard Villa Cped, CFo, CFm

## 2023-07-22 DIAGNOSIS — M109 Gout, unspecified: Secondary | ICD-10-CM | POA: Diagnosis not present

## 2023-07-22 DIAGNOSIS — Z0000 Encounter for general adult medical examination without abnormal findings: Secondary | ICD-10-CM | POA: Diagnosis not present

## 2023-07-22 DIAGNOSIS — Z6833 Body mass index (BMI) 33.0-33.9, adult: Secondary | ICD-10-CM | POA: Diagnosis not present

## 2023-07-22 DIAGNOSIS — E1169 Type 2 diabetes mellitus with other specified complication: Secondary | ICD-10-CM | POA: Diagnosis not present

## 2023-07-22 DIAGNOSIS — K219 Gastro-esophageal reflux disease without esophagitis: Secondary | ICD-10-CM | POA: Diagnosis not present

## 2023-07-22 DIAGNOSIS — E782 Mixed hyperlipidemia: Secondary | ICD-10-CM | POA: Diagnosis not present

## 2023-07-22 DIAGNOSIS — F419 Anxiety disorder, unspecified: Secondary | ICD-10-CM | POA: Diagnosis not present

## 2023-07-22 DIAGNOSIS — I10 Essential (primary) hypertension: Secondary | ICD-10-CM | POA: Diagnosis not present

## 2024-01-09 DIAGNOSIS — K760 Fatty (change of) liver, not elsewhere classified: Secondary | ICD-10-CM | POA: Diagnosis not present

## 2024-01-09 DIAGNOSIS — E1169 Type 2 diabetes mellitus with other specified complication: Secondary | ICD-10-CM | POA: Diagnosis not present

## 2024-01-09 DIAGNOSIS — E782 Mixed hyperlipidemia: Secondary | ICD-10-CM | POA: Diagnosis not present

## 2024-01-09 DIAGNOSIS — I10 Essential (primary) hypertension: Secondary | ICD-10-CM | POA: Diagnosis not present

## 2024-01-16 DIAGNOSIS — Z23 Encounter for immunization: Secondary | ICD-10-CM | POA: Diagnosis not present
